# Patient Record
Sex: Female | Born: 1975 | Race: White | Hispanic: No | Marital: Married | Smoking: Never smoker
Health system: Southern US, Community
[De-identification: ages and names within clinical notes are randomized; demographics above are authoritative.]

## PROBLEM LIST (undated history)

## (undated) DIAGNOSIS — F419 Anxiety disorder, unspecified: Secondary | ICD-10-CM

## (undated) DIAGNOSIS — R7303 Prediabetes: Secondary | ICD-10-CM

## (undated) DIAGNOSIS — I10 Essential (primary) hypertension: Secondary | ICD-10-CM

## (undated) DIAGNOSIS — N201 Calculus of ureter: Secondary | ICD-10-CM

## (undated) DIAGNOSIS — E559 Vitamin D deficiency, unspecified: Secondary | ICD-10-CM

## (undated) DIAGNOSIS — E039 Hypothyroidism, unspecified: Secondary | ICD-10-CM

## (undated) DIAGNOSIS — I252 Old myocardial infarction: Secondary | ICD-10-CM

## (undated) DIAGNOSIS — N200 Calculus of kidney: Secondary | ICD-10-CM

## (undated) HISTORY — PX: OTHER SURGICAL HISTORY: SHX169

## (undated) HISTORY — DX: Hypothyroidism, unspecified: E03.9

## (undated) HISTORY — DX: Essential (primary) hypertension: I10

## (undated) HISTORY — DX: Calculus of kidney: N20.0

## (undated) HISTORY — DX: Old myocardial infarction: I25.2

## (undated) HISTORY — DX: Anxiety disorder, unspecified: F41.9

## (undated) HISTORY — DX: Vitamin D deficiency, unspecified: E55.9

---

## 2000-12-16 ENCOUNTER — Emergency Department (HOSPITAL_COMMUNITY): Admission: EM | Admit: 2000-12-16 | Discharge: 2000-12-17 | Payer: Self-pay | Admitting: Emergency Medicine

## 2000-12-17 ENCOUNTER — Encounter: Payer: Self-pay | Admitting: Emergency Medicine

## 2000-12-17 ENCOUNTER — Encounter: Payer: Self-pay | Admitting: Urology

## 2000-12-17 ENCOUNTER — Ambulatory Visit (HOSPITAL_COMMUNITY): Admission: AD | Admit: 2000-12-17 | Discharge: 2000-12-17 | Payer: Self-pay | Admitting: Urology

## 2000-12-17 ENCOUNTER — Encounter (INDEPENDENT_AMBULATORY_CARE_PROVIDER_SITE_OTHER): Payer: Self-pay | Admitting: Specialist

## 2002-10-18 ENCOUNTER — Other Ambulatory Visit: Admission: RE | Admit: 2002-10-18 | Discharge: 2002-10-18 | Payer: Self-pay | Admitting: Family Medicine

## 2003-10-31 ENCOUNTER — Other Ambulatory Visit: Admission: RE | Admit: 2003-10-31 | Discharge: 2003-10-31 | Payer: Self-pay | Admitting: Family Medicine

## 2005-01-05 ENCOUNTER — Other Ambulatory Visit: Admission: RE | Admit: 2005-01-05 | Discharge: 2005-01-05 | Payer: Self-pay | Admitting: Family Medicine

## 2005-03-13 ENCOUNTER — Other Ambulatory Visit: Admission: RE | Admit: 2005-03-13 | Discharge: 2005-03-13 | Payer: Self-pay | Admitting: Obstetrics and Gynecology

## 2005-08-12 ENCOUNTER — Ambulatory Visit: Payer: Self-pay | Admitting: Obstetrics and Gynecology

## 2005-08-19 ENCOUNTER — Ambulatory Visit: Payer: Self-pay | Admitting: Obstetrics and Gynecology

## 2005-08-19 ENCOUNTER — Inpatient Hospital Stay (HOSPITAL_COMMUNITY): Admission: AD | Admit: 2005-08-19 | Discharge: 2005-08-21 | Payer: Self-pay | Admitting: Obstetrics and Gynecology

## 2005-08-26 ENCOUNTER — Ambulatory Visit: Payer: Self-pay | Admitting: Obstetrics and Gynecology

## 2005-09-02 ENCOUNTER — Ambulatory Visit: Payer: Self-pay | Admitting: Gynecology

## 2005-09-08 ENCOUNTER — Ambulatory Visit: Payer: Self-pay | Admitting: Obstetrics and Gynecology

## 2005-09-15 ENCOUNTER — Ambulatory Visit: Payer: Self-pay | Admitting: Obstetrics and Gynecology

## 2005-09-17 ENCOUNTER — Encounter (INDEPENDENT_AMBULATORY_CARE_PROVIDER_SITE_OTHER): Payer: Self-pay | Admitting: *Deleted

## 2005-09-17 ENCOUNTER — Inpatient Hospital Stay (HOSPITAL_COMMUNITY): Admission: RE | Admit: 2005-09-17 | Discharge: 2005-09-20 | Payer: Self-pay | Admitting: Obstetrics and Gynecology

## 2005-09-23 ENCOUNTER — Inpatient Hospital Stay (HOSPITAL_COMMUNITY): Admission: AD | Admit: 2005-09-23 | Discharge: 2005-09-23 | Payer: Self-pay | Admitting: Obstetrics and Gynecology

## 2006-10-26 ENCOUNTER — Ambulatory Visit: Payer: Self-pay | Admitting: Family Medicine

## 2006-11-11 DIAGNOSIS — Z87442 Personal history of urinary calculi: Secondary | ICD-10-CM

## 2006-11-12 ENCOUNTER — Other Ambulatory Visit: Admission: RE | Admit: 2006-11-12 | Discharge: 2006-11-12 | Payer: Self-pay | Admitting: Family Medicine

## 2006-11-12 ENCOUNTER — Ambulatory Visit: Payer: Self-pay | Admitting: Family Medicine

## 2006-11-12 ENCOUNTER — Encounter (INDEPENDENT_AMBULATORY_CARE_PROVIDER_SITE_OTHER): Payer: Self-pay | Admitting: Family Medicine

## 2006-11-12 ENCOUNTER — Encounter: Payer: Self-pay | Admitting: Family Medicine

## 2006-11-24 ENCOUNTER — Ambulatory Visit: Payer: Self-pay | Admitting: Family Medicine

## 2006-11-25 LAB — CONVERTED CEMR LAB
Basophils Relative: 0.8 % (ref 0.0–1.0)
Eosinophils Relative: 1.8 % (ref 0.0–5.0)
HCT: 34.7 % — ABNORMAL LOW (ref 36.0–46.0)
Hemoglobin: 11.5 g/dL — ABNORMAL LOW (ref 12.0–15.0)
Lymphocytes Relative: 21.5 % (ref 12.0–46.0)
Monocytes Absolute: 0.5 10*3/uL (ref 0.2–0.7)
Neutro Abs: 5 10*3/uL (ref 1.4–7.7)
Platelets: 314 10*3/uL (ref 150–400)
TSH: 5.35 microintl units/mL (ref 0.35–5.50)
VLDL: 13 mg/dL (ref 0–40)
WBC: 7.2 10*3/uL (ref 4.5–10.5)

## 2006-11-29 ENCOUNTER — Telehealth (INDEPENDENT_AMBULATORY_CARE_PROVIDER_SITE_OTHER): Payer: Self-pay | Admitting: *Deleted

## 2006-12-27 ENCOUNTER — Encounter (INDEPENDENT_AMBULATORY_CARE_PROVIDER_SITE_OTHER): Payer: Self-pay | Admitting: Family Medicine

## 2007-06-03 ENCOUNTER — Ambulatory Visit: Payer: Self-pay | Admitting: Family Medicine

## 2007-06-03 DIAGNOSIS — S93609A Unspecified sprain of unspecified foot, initial encounter: Secondary | ICD-10-CM | POA: Insufficient documentation

## 2007-06-08 ENCOUNTER — Telehealth (INDEPENDENT_AMBULATORY_CARE_PROVIDER_SITE_OTHER): Payer: Self-pay | Admitting: Family Medicine

## 2007-06-10 ENCOUNTER — Ambulatory Visit: Payer: Self-pay | Admitting: Family Medicine

## 2007-06-21 ENCOUNTER — Telehealth (INDEPENDENT_AMBULATORY_CARE_PROVIDER_SITE_OTHER): Payer: Self-pay | Admitting: *Deleted

## 2008-08-09 ENCOUNTER — Encounter: Admission: RE | Admit: 2008-08-09 | Discharge: 2008-08-09 | Payer: Self-pay | Admitting: Obstetrics and Gynecology

## 2010-11-14 NOTE — Discharge Summary (Signed)
Elizabeth Gibson, Elizabeth Gibson                 ACCOUNT NO.:  [id number]   MEDICAL RECORD NO.:  [id number]          PATIENT TYPE:  INP   LOCATION:  9104                          FACILITY:  [facility]   PHYSICIAN:  Juluis Mire, M.D.   DATE OF BIRTH:  01-28-1976   DATE OF ADMISSION:  09/17/2005  DATE OF DISCHARGE:  09/20/2005                                 DISCHARGE SUMMARY   ADMITTING DIAGNOSES:  1.  Intrauterine pregnancy at term.  2.  Twin gestation with a breech/breech presentation.  3.  Mild pregnancy-induced hypertension.   DISCHARGE DIAGNOSES:  1.  Status post low transverse cesarean section.  2.  Viable female and female twin infants.   PROCEDURE:  Primary low transverse cesarean section.   REASON FOR ADMISSION:  Please see dictated H&P.   HOSPITAL COURSE:  The patient is 35 year old white married female who was  admitted to [facility] for a scheduled cesarean section.  The patient had known twin gestation with a breech/breech presentation.  The  patient had been followed closely in the office with weekly nonstress tests  which had been reactive.  With the last visit, the patient was noted to have  an elevation of blood pressure of 136/94.  The patient was also noted to  have 1+ proteinuria.  PIH labs had been drawn and were all normal with the  exception of one slightly elevated SGPT which was 41.  The patient was now  admitted for a scheduled cesarean section.  On admission, vital signs stable  with a blood pressure of 138/80.  The patient was taken to the operating  room where spinal anesthesia was administered without difficulty.  A low  transverse incision was made with the delivery of viable baby A female infant  weighing 7 pounds 1 ounce with Apgars of 7 at one minute, 7 at five minutes,  9 at 10 minutes.  Baby B female infant weighing 6 pounds 10 ounces, Apgars  of 8 at one minute and 9 at five minutes and 9 at ten minutes.  The patient  tolerated the procedure well  and was taken to the recovery room in stable  condition.   On postoperative day #1, the patient was without complaint.  Vital signs  were stable.  She was afebrile.  Abdomen was soft with good return of bowel  function.  Abdominal dressing was noted to be clean, dry and intact.  Laboratory findings revealed hemoglobin of 8.0.  SGOT and SGPT were within  normal limits.  Platelet count of 212,000.  The patient was ambulating well  and tolerating a regular diet without complaints of nausea and vomiting.   On postoperative day #2, the patient was without complaint.  Vital signs  remained stable.  She is afebrile.  Fundus firm and nontender.  Abdominal  dressing had been removed, revealing an incision that was clean, dry and  intact.   On postoperative day #3, the patient was without complaint.  Vital signs  were stable.  She was afebrile.  Fundus was firm and nontender.  The  incision was clean,  dry and intact.  Staples were removed, and the patient  was discharged home.   CONDITION ON DISCHARGE:  Stable.   DIET:  Regular as tolerated.   ACTIVITY:  No heavy lifting, no driving x2 weeks, no vaginal entry.   FOLLOW UP:  Patient is to follow up in the office in 1 week for an incision  check.  She is to call for temperature greater than 100 degrees, persistent  nausea or vomiting, heavy vaginal bleeding and/or redness or drainage from  the incisional site.   DISCHARGE MEDICATIONS:  1.  Tylox, #30, one p.o. every 4-6 hours p.r.n.  2.  Motrin 600 mg every 6 hours.  3.  Prenatal vitamins one p.o. daily.  4.  Colace one p.o. daily p.r.n.      Julio Sicks, N.P.      Juluis Mire, M.D.  Electronically Signed    CC/MEDQ  D:  10/09/2005  T:  10/09/2005  Job:  [id number]

## 2010-11-14 NOTE — Op Note (Signed)
[facility]  Patient:    Elizabeth Gibson, Elizabeth Gibson                         MRN: [id number] Proc. Date: 12/17/00 Adm. Date:  [id number] Attending:  Evlyn Clines                           Operative Report  PROCEDURE:  Cystoscopy, right ureteroscopic stone extraction, insertion of right double J stent.  PREOPERATIVE DIAGNOSIS:  Right proximal ureteral stone.  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  SURGEON:  Dr. Bjorn Pippin.  ANESTHESIA:  General.  SPECIMEN:  Stone.  DRAINS:  6 French 24 cm double J stent.  COMPLICATIONS:  None.  INDICATIONS FOR PROCEDURE:  [name] is a 36 year old white female who was sent from the emergency room with a right proximal ureteral stone and pain. After discussing the treatment options, she elected to proceed with ureteroscopy. She had some pyuria in the office and Levaquin was given at noon today.  FINDINGS AND PROCEDURE:  The patient was taken to the operating room where a general anesthetic was induced. She was placed in lithotomy position, her perineum and genitalia were prepped with Betadine solution and she was draped in the usual sterile fashion. Cystoscopy was performed using a 22 [place] scope and the 12 and 70 degree lenses. Examination revealed a normal urethra. The bladder wall was smooth and pale with a few small nodules consistent with follicular cystitis. The ureteral orifices were in the normal anatomic position. The right ureteral orifice was cannulated with a standard guidewire, this was passed through the kidney without difficulty. The cystoscope was removed and a 12/14 introducer sheath was then passed over the wire to the upper ureter. The dilator cord was removed and a 6 French flexible ureteroscope was then passed through the sheath to the proximal ureter. The scope was advanced into the kidney where fragments of the stone were identified. These  were engaged with a nitinol stone basket and removed in two passes. Reinspection of the kidney after removal of these stone fragments revealed no residual fragments. Inspection of the ureter revealed no residual fragments. At this point, the ureteroscope was removed, the guidewire was replaced, the sheath was removed, the cystoscope was inserted over the wire and a 6 French 24 cm double J stent with string was inserted without difficulty under fluoroscopic guidance. The wire was removed using good coil in the kidney, a good coil in the bladder. The bladder was drained, the cystoscope was removed. The stent string was tied and cut short. The patient was taken down from lithotomy position, her anesthetic was reversed and she was moved to the recovery room in stable condition. There were no complications. DD:  12/17/00 TD:  12/18/00 Job: [id number]

## 2010-11-14 NOTE — H&P (Signed)
Elizabeth Gibson, BAUMGARNER                 ACCOUNT NO.:  [id number]   MEDICAL RECORD NO.:  [id number]          PATIENT TYPE:  INP   LOCATION:                                FACILITY:  [facility]   PHYSICIAN:  Duke Salvia. Marcelle Overlie, M.D.DATE OF BIRTH:  1975-09-13   DATE OF ADMISSION:  09/17/2005  DATE OF DISCHARGE:                                HISTORY & PHYSICAL   Scheduled for primary cesarean section for twins on September 17, 2005.  Last  ultrasound showed presentation breech vertex.  She has had weekly twin  nonstress tests that have been reactive the last of which was September 15, 2005.  At that exam, BP 136/94, recheck left lateral 104/68 with 1+ protein,  minimal lower extremity edema, good fetal movement, no other complaints at  all.  PIH labs, dated September 15, 2005, were normal except for SGPT that was  41 (normal 0-40.)  This procedure including risk of bleeding, infection,  transfusion, adjacent organ injury, and her expected recovery time all  reviewed with her which she understands and accepts.   B positive.  Rubella titer is immune.  Her booking BP was up slightly at  138/80.  One-hour GTT was 129.   PAST MEDICAL HISTORY:   ALLERGIES:  None.   OPERATIONS:  Kidney stone surgically removed, 2003.   PHYSICAL EXAMINATION:  VITAL SIGNS:  Temp 98.2, blood pressure 104/68.  HEENT:  Unremarkable.  NECK:  Supple without masses.  LUNGS:  Clear.  CARDIOVASCULAR:  Regular rate and rhythm without murmurs, rubs, or gallops.  BREASTS:  Without masses.  ABDOMEN:  Fundal height was 45-cm.  Fetal heart rate A and B was in the  140s.  PELVIC:  Cervix was closed and long.  EXTREMITIES:  Revealed 1+ edema.  Reflexes 1 to 2+.  No clonus.   IMPRESSION:  1.  Breech vertex twin gestation near term.  2.  Mild pregnancy induced hypertension.   PLAN:  Primary cesarean section.  Procedure and risks were reviewed as  above.      Richard M. Marcelle Overlie, M.D.  Electronically Signed     RMH/MEDQ  D:   09/15/2005  T:  09/15/2005  Job:  [id number]

## 2010-11-14 NOTE — Op Note (Signed)
NAMELUCINDIA, Elizabeth Gibson                 ACCOUNT NO.:  [id number]   MEDICAL RECORD NO.:  [id number]          PATIENT TYPE:  INP   LOCATION:  WOC                           [facility]ILITY[facility]   PHYSICIAN:  Duke Salvia. Marcelle Overlie, M.D.DATE OF BIRTH:  1975/12/09   DATE OF PROCEDURE:  09/17/2005  DATE OF DISCHARGE:                                 OPERATIVE REPORT   PREOPERATIVE DIAGNOSIS:  Term twin gestation, breech-breech presentation.   POSTOPERATIVE DIAGNOSES:  Term twin gestation, breech-breech presentation.   PROCEDURE:  Primary low transverse cesarean section.   SURGEON:  Duke Salvia. Marcelle Overlie, M.D.   ASSESSMENT:  [name]. Henderson Cloud, M.D.   ANESTHESIA:  Spinal.   COMPLICATIONS:  None.   DRAINS:  Foley catheter.   ESTIMATED BLOOD LOSS:  900.   DESCRIPTION OF PROCEDURE:  The patient was taken to the operating room and  after an adequate level regional anesthesia was obtained with the patient in  the left tilt position, the abdomen prepped and draped in the usual manner  for sterile abdominal procedure. A Foley catheter positioned draining clear  urine, transverse Pfannenstiel incision was made 3 fingerbreadth's above the  symphysis, carried down to the fascia which was incised and extended  transversely. Rectus muscle divided in the midline, peritoneal entered  superiorly without incident and extended vertically. The bladder blade was  repositioned, the vesicouterine serosa was then incised and the bladder was  bluntly and sharply dissected off of the lower uterine segment and the  bladder blade repositioned. A transverse incision was made in the lower  segment, extended with blunt dissection transversely. Baby A was a boy in  the breech presentation delivered without difficulty, Apgar 7&7. The second  twin full breech extraction from a separate sac both with clear fluid, a  girl, Apgar were 8&9, both weights pending. The placenta was delivered  manually intact, sent to pathology. The uterus  was exteriorized, cavity  wiped cleaned with a laparotomy pack, closure obtained with a first layer of  #0 chromic in a locked fashion followed by an imbricating layer of #0  chromic. This was hemostatic, the tubes and ovaries were inspected and noted  to be normal. Prior to closure, sponge, needle and instrument counts were  reported as correct x2, peritoneum closed with a running 3-0 Vicryl suture.  The rectus muscle was reapproximated in the midline with 3-0 Vicryl  interrupted sutures, fascia closed from laterally to midline on either side  with a #0 PDS suture. The subcutaneous fat was hemostatic, clips and Steri-  Strips used on the skin. She  tolerated this well and went to the recovery room in good condition. She did  receive Ancef 1 gram IV after the cord was clamped along with Pitocin IV,  clear urine noted at the end of the case. Mother and baby is doing well at  that point.      Richard M. Marcelle Overlie, M.D.  Electronically Signed     RMH/MEDQ  D:  09/17/2005  T:  09/18/2005  Job:  [id number]

## 2011-09-19 LAB — HM PAP SMEAR: HM Pap smear: NORMAL

## 2012-08-26 ENCOUNTER — Encounter: Payer: Self-pay | Admitting: Family Medicine

## 2012-09-15 ENCOUNTER — Encounter: Payer: Self-pay | Admitting: Family Medicine

## 2012-10-13 ENCOUNTER — Ambulatory Visit: Payer: Self-pay | Admitting: Family Medicine

## 2012-11-16 ENCOUNTER — Ambulatory Visit: Payer: Self-pay | Admitting: Family Medicine

## 2012-12-08 ENCOUNTER — Encounter: Payer: Self-pay | Admitting: Family Medicine

## 2012-12-08 ENCOUNTER — Ambulatory Visit (INDEPENDENT_AMBULATORY_CARE_PROVIDER_SITE_OTHER): Payer: BC Managed Care – PPO | Admitting: Family Medicine

## 2012-12-08 VITALS — BP 130/80 | HR 67 | Temp 98.2°F | Ht 66.0 in | Wt 271.8 lb

## 2012-12-08 DIAGNOSIS — Z0000 Encounter for general adult medical examination without abnormal findings: Secondary | ICD-10-CM

## 2012-12-08 NOTE — Progress Notes (Signed)
  Subjective:    Patient ID: Elizabeth Gibson, female    DOB: [birth year], 37 y.o.   MRN: [id number]  HPI New to Establish.  Previous MD- Duanne Guess.  CPE- last pap 2013.  Recently lost 80 lbs and now has loose skin.  Feels like she has a boil in L thigh where skin is folding.   Review of Systems Patient reports no vision/ hearing changes, adenopathy,fever, weight change,  persistant/recurrent hoarseness , swallowing issues, chest pain, palpitations, edema, persistant/recurrent cough, hemoptysis, dyspnea (rest/exertional/paroxysmal nocturnal), gastrointestinal bleeding (melena, rectal bleeding), abdominal pain, significant heartburn, bowel changes, GU symptoms (dysuria, hematuria, incontinence), Gyn symptoms (abnormal  bleeding, pain),  syncope, focal weakness, memory loss, numbness & tingling, skin/hair/nail changes, abnormal bruising or bleeding, anxiety, or depression.     Objective:   Physical Exam  General Appearance:    Alert, cooperative, no distress, appears stated age  Head:    Normocephalic, without obvious abnormality, atraumatic  Eyes:    PERRL, conjunctiva/corneas clear, EOM's intact, fundi    benign, both eyes  Ears:    Normal TM's and external ear canals, both ears  Nose:   Nares normal, septum midline, mucosa normal, no drainage    or sinus tenderness  Throat:   Lips, mucosa, and tongue normal; teeth and gums normal  Neck:   Supple, symmetrical, trachea midline, no adenopathy;    Thyroid: no enlargement/tenderness/nodules  Back:     Symmetric, no curvature, ROM normal, no CVA tenderness  Lungs:     Clear to auscultation bilaterally, respirations unlabored  Chest Wall:    No tenderness or deformity   Heart:    Regular rate and rhythm, S1 and S2 normal, no murmur, rub   or gallop  Breast Exam:    No tenderness, masses, or nipple abnormality  Abdomen:     Soft, non-tender, bowel sounds active all four quadrants,    no masses, no organomegaly  Genitalia:    deferred  Rectal:     Extremities:   Extremities normal, atraumatic, no cyanosis or edema  Pulses:   2+ and symmetric all extremities  Skin:   Skin color, texture, turgor normal, no rashes or lesions  Lymph nodes:   Cervical, supraclavicular, and axillary nodes normal  Neurologic:   CNII-XII intact, normal strength, sensation and reflexes    throughout          Assessment & Plan:

## 2012-12-08 NOTE — Patient Instructions (Addendum)
We'll notify you of your lab results and make any changes if needed Keep up the good work on your healthy diet and regular exercise Call with any questions or concerns Think of [place] as your home base! Welcome!  We're glad to have you!!!

## 2012-12-09 LAB — HEPATIC FUNCTION PANEL
Albumin: 4 g/dL (ref 3.5–5.2)
Bilirubin, Direct: 0.1 mg/dL (ref 0.0–0.3)
Total Protein: 7.3 g/dL (ref 6.0–8.3)

## 2012-12-09 LAB — LIPID PANEL
Cholesterol: 137 mg/dL (ref 0–200)
Triglycerides: 97 mg/dL (ref 0.0–149.0)

## 2012-12-09 LAB — BASIC METABOLIC PANEL
CO2: 27 mEq/L (ref 19–32)
Calcium: 9 mg/dL (ref 8.4–10.5)
Creatinine, Ser: 0.7 mg/dL (ref 0.4–1.2)
Glucose, Bld: 79 mg/dL (ref 70–99)

## 2012-12-09 LAB — CBC WITH DIFFERENTIAL/PLATELET
Basophils Absolute: 0 10*3/uL (ref 0.0–0.1)
HCT: 37.8 % (ref 36.0–46.0)
Hemoglobin: 12.8 g/dL (ref 12.0–15.0)
Lymphs Abs: 1.9 10*3/uL (ref 0.7–4.0)
Monocytes Relative: 7.6 % (ref 3.0–12.0)
Neutro Abs: 5.2 10*3/uL (ref 1.4–7.7)
RDW: 12.5 % (ref 11.5–14.6)

## 2012-12-09 LAB — TSH: TSH: 2.36 u[IU]/mL (ref 0.35–5.50)

## 2012-12-09 NOTE — Assessment & Plan Note (Signed)
Pt's PE WNL.  UTD on GYN.  Check labs.  Anticipatory guidance provided.  

## 2012-12-13 ENCOUNTER — Encounter: Payer: Self-pay | Admitting: *Deleted

## 2012-12-14 LAB — VITAMIN D 1,25 DIHYDROXY: Vitamin D2 1, 25 (OH)2: <8 pg/mL

## 2012-12-22 ENCOUNTER — Encounter: Payer: Self-pay | Admitting: *Deleted

## 2013-09-08 ENCOUNTER — Ambulatory Visit: Payer: BC Managed Care – PPO | Admitting: Family Medicine

## 2013-09-18 ENCOUNTER — Ambulatory Visit (INDEPENDENT_AMBULATORY_CARE_PROVIDER_SITE_OTHER): Payer: BC Managed Care – PPO | Admitting: Family Medicine

## 2013-09-18 ENCOUNTER — Encounter: Payer: Self-pay | Admitting: Family Medicine

## 2013-09-18 VITALS — BP 130/80 | HR 66 | Temp 98.2°F | Resp 16 | Wt 275.1 lb

## 2013-09-18 DIAGNOSIS — T7840XA Allergy, unspecified, initial encounter: Secondary | ICD-10-CM

## 2013-09-18 MED ORDER — PREDNISONE 10 MG PO TABS: ORAL_TABLET | ORAL | Status: DC

## 2013-09-18 MED ORDER — METHYLPREDNISOLONE ACETATE 80 MG/ML IJ SUSP
80.0000 mg | Freq: Once | INTRAMUSCULAR | Status: AC
  Administered 2013-09-18: 80 mg via INTRAMUSCULAR

## 2013-09-18 MED ORDER — HYDROXYZINE HCL 50 MG PO TABS: 50.0000 mg | ORAL_TABLET | Freq: Three times a day (TID) | ORAL | Status: DC | PRN

## 2013-09-18 NOTE — Progress Notes (Signed)
   Subjective:    Patient ID: Elizabeth LeitzCasey Gibson, female    DOB: 07/16/1975, 38 y.o.   MRN: [id number]  HPI Allergies- pt reports sxs started a few years ago in the spring.  Hands, arms, neck, face, tops of feet will swell.  + itching.  No sxs in areas that are covered w/ clothing.  Taking Zyrtec w/o relief.  Using benadryl cream and coconut oil.  Took claritin and singulair previously w/o relief.  No similar sxs when she was in [place].   Review of Systems For ROS see HPI     Objective:   Physical Exam  Vitals reviewed. Constitutional: She appears well-developed and well-nourished. No distress.  Skin: Skin is warm and dry. There is erythema (diffusely on hands, arms, neck, face w/ swelling).          Assessment & Plan:

## 2013-09-18 NOTE — Assessment & Plan Note (Signed)
Pt appears to be having contact dermatitis to unknown allergic trigger- most likely pollen.  Start steroid taper, hydroxyzine, continue zyrtec.  Refer to allergist.  Pt expressed understanding and is in agreement w/ plan.

## 2013-09-18 NOTE — Patient Instructions (Signed)
We'll call you with your Allergy appt Start the Prednisone tomorrow Use the Hydroxyzine as needed for itching Call with any questions or concerns Hang in there!!

## 2013-09-18 NOTE — Progress Notes (Signed)
Pre visit review using our clinic review tool, if applicable. No additional management support is needed unless otherwise documented below in the visit note. 

## 2013-12-19 ENCOUNTER — Encounter: Payer: Self-pay | Admitting: Family Medicine

## 2013-12-19 ENCOUNTER — Telehealth: Payer: Self-pay

## 2013-12-19 ENCOUNTER — Ambulatory Visit (INDEPENDENT_AMBULATORY_CARE_PROVIDER_SITE_OTHER): Payer: BC Managed Care – PPO | Admitting: Family Medicine

## 2013-12-19 VITALS — BP 142/100 | HR 86 | Temp 98.6°F | Ht 66.0 in | Wt 296.0 lb

## 2013-12-19 DIAGNOSIS — L255 Unspecified contact dermatitis due to plants, except food: Secondary | ICD-10-CM

## 2013-12-19 MED ORDER — PREDNISONE 10 MG PO TABS: ORAL_TABLET | ORAL | Status: DC

## 2013-12-19 NOTE — Telephone Encounter (Addendum)
Pt called in tears stating that she feels miserable.  Recently she was gardening and got poison ivy all over her face, into her eyes, her mouth, on her feet, between her toes and over her chest and arms.  She went to urgent care and received a shot of prednisone.  She was also given a 6 day round of prednisone and was encouraged to take benadryl.  Her symptoms got better, but never completely went away.  She still has an itchy rash throughout her body.  Pt [facility] and was started on another round of prednisone.  Pt was not completely satisfied with the visit as the provider was not Dr. Beverely Lowabori and she felt the visit went too quick.  Therefore, she now wants to see Dr. Beverely Lowabori.  Pt stated that "I just itch all over and I'm miserable."  Pt's need for the prednisone was re-enforced.  Pt was also advised to use an oral antihistamine such benadryl and/or hydrocortisone cream topically for itching, to avoid the heat and hot showers as this may increase itching, to wear loose fitting clothes, and to avoid scratching.  Pt was also scheduled a follow up appointment, however, the appointment desk also shows that patient has physical appointment coming up on July 1st.  Pt felt that this appointment would suffice.  Pt was satisfied with the advice given and agreed to follow advice.  As advice was given, patient began to calm down.  She stated that she was just glad to have someone to talk to about what was going on.  Pt was encouraged to call us back if her symptoms worsen, new symptoms developed, or her current symptoms failed to improve. Pt stated understanding and agreed.

## 2013-12-19 NOTE — Patient Instructions (Signed)
       Poison Ivy Poison ivy is a inflammation of the skin (contact dermatitis) caused by touching the allergens on the leaves of the ivy plant following previous exposure to the plant. The rash usually appears 48 hours after exposure. The rash is usually bumps (papules) or blisters (vesicles) in a linear pattern. Depending on your own sensitivity, the rash may simply cause redness and itching, or it may also progress to blisters which may break open. These must be well cared for to prevent secondary bacterial (germ) infection, followed by scarring. Keep any open areas dry, clean, dressed, and covered with an antibacterial ointment if needed. The eyes may also get puffy. The puffiness is worst in the morning and gets better as the day progresses. This dermatitis usually heals without scarring, within 2 to 3 weeks without treatment. HOME CARE INSTRUCTIONS  Thoroughly wash with soap and water as soon as you have been exposed to poison ivy. You have about one half hour to remove the plant resin before it will cause the rash. This washing will destroy the oil or antigen on the skin that is causing, or will cause, the rash. Be sure to wash under your fingernails as any plant resin there will continue to spread the rash. Do not rub skin vigorously when washing affected area. Poison ivy cannot spread if no oil from the plant remains on your body. A rash that has progressed to weeping sores will not spread the rash unless you have not washed thoroughly. It is also important to wash any clothes you have been wearing as these may carry active allergens. The rash will return if you wear the unwashed clothing, even several days later. Avoidance of the plant in the future is the best measure. Poison ivy plant can be recognized by the number of leaves. Generally, poison ivy has three leaves with flowering branches on a single stem. Diphenhydramine may be purchased over the counter and used as needed for itching. Do not  drive with this medication if it makes you drowsy.Ask your caregiver about medication for children. SEEK MEDICAL CARE IF:  Open sores develop.  Redness spreads beyond area of rash.  You notice purulent (pus-like) discharge.  You have increased pain.  Other signs of infection develop (such as fever). Document Released: 06/12/2000 Document Revised: 09/07/2011 Document Reviewed: 05/01/2009 [employer] Patient Information 2015 [employer]. This information is not intended to replace advice given to you by your health care provider. Make sure you discuss any questions you have with your health care provider.  

## 2013-12-19 NOTE — Telephone Encounter (Signed)
Agree w/ advice given.  Thank you for the time you spent speaking to patient.

## 2013-12-19 NOTE — Progress Notes (Signed)
No chief complaint on file.   HPI:  Poison Ivy: - was gardening and got poison ivy all over on face, oral, feet and chest and arms - treated with shot of prednisone and then 6 day taper of prednisone and benadryl 2 weeks ago - got a lot better but never resolved still has itching and rash on arms and neck and face and trunk and leg - initially blisters, itchy rash all over -denies: fevers, malaise, SOB, wheezing, oral or eye lesions now  ROS: See pertinent positives and negatives per HPI.  Past Medical History  Diagnosis Date  . Hypertension   . Kidney stone     Past Surgical History  Procedure Laterality Date  . Cesarean section    . Removal of kidney stones      Family History  Problem Relation Age of Onset  . Cancer Maternal Aunt     lung  . Cancer Paternal Aunt     breast  . Cancer Paternal Grandmother     breast    History   Social History  . Marital Status: Married    Spouse Name: N/A    Number of Children: N/A  . Years of Education: N/A   Social History Main Topics  . Smoking status: Never Smoker   . Smokeless tobacco: Never Used  . Alcohol Use: No  . Drug Use: No  . Sexual Activity: None   Other Topics Concern  . None   Social History Narrative  . None    Current outpatient prescriptions:levonorgestrel (MIRENA) 20 MCG/24HR IUD, 1 each by Intrauterine route once., Disp: , Rfl: ;  predniSONE (DELTASONE) 10 MG tablet, 4 tablets (40 mg) daily for 3 days, then 3 tablets (30mg ) daily for 3 days, then 2 tablets (20 mg) daily for 3 days, then 1 tablet (10 mg) daily for 3 days.  12 + 9 + 6 + 3, Disp: 30 tablet, Rfl: 0  EXAM:  Filed Vitals:   12/19/13 1601  BP: 142/100  Pulse: 86  Temp: 98.6 F (37 C)    Body mass index is 47.8 kg/(m^2).  GENERAL: vitals reviewed and listed above, alert, oriented, appears well hydrated and in no acute distress  HEENT: atraumatic, conjunttiva clear, no obvious abnormalities on inspection of external nose and  ears  NECK: no obvious masses on inspection  LUNGS: clear to auscultation bilaterally, no wheezes, rales or rhonchi, good air movement  CV: HRRR, no peripheral edema  MS: moves all extremities without noticeable abnormality  SKIN: erythematous papuluar rash in patchy distribution on arms, feet, legs, trunk, neck  PSYCH: pleasant and cooperative, no obvious depression or anxiety  ASSESSMENT AND PLAN:  Discussed the following assessment and plan:  Toxicodendron dermatitis - Plan: predniSONE (DELTASONE) 10 MG tablet  -discussed likely rebound reaction form inadequate course of prednisone (discussed risks) -follow up with PCP in the next 2-4 weeks -Patient advised to return or notify a doctor immediately if symptoms worsen or persist or new concerns arise.  Patient Instructions  Poison [facility] Po[facility] ivy is a inflammation of the skin (contact dermatitis) caused by touching the allergens on the leaves of the ivy plant following previous exposure to the plant. The rash usually appears 48 hours after exposure. The rash is usually bumps (papules) or blisters (vesicles) in a linear pattern. Depending on your own sensitivity, the rash may simply cause redness and itching, or it may also progress to blisters which may break open. These must be well cared for to prevent  secondary bacterial (germ) infection, followed by scarring. Keep any open areas dry, clean, dressed, and covered with an antibacterial ointment if needed. The eyes may also get puffy. The puffiness is worst in the morning and gets better as the day progresses. This dermatitis usually heals without scarring, within 2 to 3 weeks without treatment. HOME CARE INSTRUCTIONS  Thoroughly wash with soap and water as soon as you have been exposed to poison ivy. You have about one half hour to remove the plant resin before it will cause the rash. This washing will destroy the oil or antigen on the skin that is causing, or will cause, the rash. Be  sure to wash under your fingernails as any plant resin there will continue to spread the rash. Do not rub skin vigorously when washing affected area. Poison ivy cannot spread if no oil from the plant remains on your body. A rash that has progressed to weeping sores will not spread the rash unless you have not washed thoroughly. It is also important to wash any clothes you have been wearing as these may carry active allergens. The rash will return if you wear the unwashed clothing, even several days later. Avoidance of the plant in the future is the best measure. Poison ivy plant can be recognized by the number of leaves. Generally, poison ivy has three leaves with flowering branches on a single stem. Diphenhydramine may be purchased over the counter and used as needed for itching. Do not drive with this medication if it makes you drowsy.Ask your caregiver about medication for children. SEEK MEDICAL CARE IF:  Open sores develop.  Redness spreads beyond area of rash.  You notice purulent (pus-like) discharge.  You have increased pain.  Other signs of infection develop (such as fever). Document Released: 06/12/2000 Document Revised: 09/07/2011 Document Reviewed: 05/01/2009 [facility] Patient Information 2015 [employer], [place]. This information is not intended to replace advice given to you by your health care Breahna Boylen. Make sure you discuss any questions you have with your health care Jonas Goh.      Kriste BasqueKIM, HANNAH R.

## 2013-12-19 NOTE — Progress Notes (Signed)
Pre visit review using our clinic review tool, if applicable. No additional management support is needed unless otherwise documented below in the visit note. 

## 2013-12-26 ENCOUNTER — Telehealth: Payer: Self-pay

## 2013-12-26 NOTE — Telephone Encounter (Signed)
Medication and allergies:  Reviewed and updated  90 day supply/mail order: n/a Local pharmacy:  [id number] - [place], [place] - [address]   Immunizations due:  See below   A/P: No changes to personal, family history or past surgical hx PAP- 09/19/11- normal Flu-did not receive Tdap- unsure of last vaccine   To Discuss with Provider: Rebound rash from poison ivy exposure.

## 2013-12-27 ENCOUNTER — Ambulatory Visit (INDEPENDENT_AMBULATORY_CARE_PROVIDER_SITE_OTHER): Payer: BC Managed Care – PPO | Admitting: Family Medicine

## 2013-12-27 ENCOUNTER — Encounter: Payer: Self-pay | Admitting: General Practice

## 2013-12-27 ENCOUNTER — Encounter: Payer: Self-pay | Admitting: Family Medicine

## 2013-12-27 VITALS — BP 132/78 | HR 76 | Temp 98.1°F | Resp 16 | Ht 66.5 in | Wt 297.5 lb

## 2013-12-27 DIAGNOSIS — Z0000 Encounter for general adult medical examination without abnormal findings: Secondary | ICD-10-CM

## 2013-12-27 DIAGNOSIS — Z23 Encounter for immunization: Secondary | ICD-10-CM

## 2013-12-27 LAB — BASIC METABOLIC PANEL
BUN: 15 mg/dL (ref 6–23)
CALCIUM: 9 mg/dL (ref 8.4–10.5)
CO2: 25 mEq/L (ref 19–32)
Chloride: 104 mEq/L (ref 96–112)
Creatinine, Ser: 0.6 mg/dL (ref 0.4–1.2)
GFR: 121.47 mL/min (ref >60.00)
GLUCOSE: 86 mg/dL (ref 70–99)
POTASSIUM: 3.8 meq/L (ref 3.5–5.1)
SODIUM: 138 meq/L (ref 135–145)

## 2013-12-27 LAB — LIPID PANEL
CHOL/HDL RATIO: 3
CHOLESTEROL: 217 mg/dL — AB (ref 0–200)
HDL: 62.5 mg/dL (ref >39.00)
LDL CALC: 141 mg/dL — AB (ref 0–99)
NONHDL: 154.5
Triglycerides: 68 mg/dL (ref 0.0–149.0)
VLDL: 13.6 mg/dL (ref 0.0–40.0)

## 2013-12-27 LAB — CBC WITH DIFFERENTIAL/PLATELET
BASOS PCT: 0.3 % (ref 0.0–3.0)
Basophils Absolute: 0 10*3/uL (ref 0.0–0.1)
EOS ABS: 0.1 10*3/uL (ref 0.0–0.7)
Eosinophils Relative: 0.5 % (ref 0.0–5.0)
HEMATOCRIT: 40.1 % (ref 36.0–46.0)
HEMOGLOBIN: 13.4 g/dL (ref 12.0–15.0)
LYMPHS ABS: 1.6 10*3/uL (ref 0.7–4.0)
LYMPHS PCT: 12.5 % (ref 12.0–46.0)
MCHC: 33.5 g/dL (ref 30.0–36.0)
MCV: 88.9 fl (ref 78.0–100.0)
MONOS PCT: 5.6 % (ref 3.0–12.0)
Monocytes Absolute: 0.7 10*3/uL (ref 0.1–1.0)
NEUTROS ABS: 10.3 10*3/uL — AB (ref 1.4–7.7)
Neutrophils Relative %: 81.1 % — ABNORMAL HIGH (ref 43.0–77.0)
Platelets: 289 10*3/uL (ref 150.0–400.0)
RBC: 4.52 Mil/uL (ref 3.87–5.11)
RDW: 13.1 % (ref 11.5–15.5)
WBC: 12.8 10*3/uL — ABNORMAL HIGH (ref 4.0–10.5)

## 2013-12-27 LAB — HEPATIC FUNCTION PANEL
ALT: 19 U/L (ref 0–35)
AST: 15 U/L (ref 0–37)
Albumin: 3.8 g/dL (ref 3.5–5.2)
Alkaline Phosphatase: 63 U/L (ref 39–117)
BILIRUBIN DIRECT: 0.1 mg/dL (ref 0.0–0.3)
TOTAL PROTEIN: 7.4 g/dL (ref 6.0–8.3)
Total Bilirubin: 0.5 mg/dL (ref 0.2–1.2)

## 2013-12-27 LAB — VITAMIN D 25 HYDROXY (VIT D DEFICIENCY, FRACTURES): VITD: 20.02 ng/mL

## 2013-12-27 LAB — HEMOGLOBIN A1C: Hgb A1c MFr Bld: 5.7 % (ref 4.6–6.5)

## 2013-12-27 LAB — TSH: TSH: 1.57 u[IU]/mL (ref 0.35–4.50)

## 2013-12-27 NOTE — Addendum Note (Signed)
Addended by: Jackson LatinoYLER, JESSICA L on: 12/27/2013 08:46 AM   Modules accepted: Orders

## 2013-12-27 NOTE — Progress Notes (Signed)
Pre visit review using our clinic review tool, if applicable. No additional management support is needed unless otherwise documented below in the visit note. 

## 2013-12-27 NOTE — Progress Notes (Signed)
   Subjective:    Patient ID: Elizabeth LeitzCasey Gibson, female    DOB: 11/15/1975, 38 y.o.   MRN: [id number]  HPI CPE- UTD on GYN.  No concerns today.   Review of Systems Patient reports no vision/ hearing changes, adenopathy,fever, weight change,  persistant/recurrent hoarseness , swallowing issues, chest pain, palpitations, edema, persistant/recurrent cough, hemoptysis, dyspnea (rest/exertional/paroxysmal nocturnal), gastrointestinal bleeding (melena, rectal bleeding), abdominal pain, significant heartburn, bowel changes, GU symptoms (dysuria, hematuria, incontinence), Gyn symptoms (abnormal  bleeding, pain),  syncope, focal weakness, memory loss, numbness & tingling, skin/hair/nail changes, abnormal bruising or bleeding, anxiety, or depression.     Objective:   Physical Exam  General Appearance:    Alert, cooperative, no distress, appears stated age, obese  Head:    Normocephalic, without obvious abnormality, atraumatic  Eyes:    R pupil is large and minimally reactive, L pupil WNL, conjunctiva/corneas clear, EOM's intact, fundi benign, both eyes  Ears:    Normal TM's and external ear canals, both ears  Nose:   Nares normal, septum midline, mucosa normal, no drainage    or sinus tenderness  Throat:   Lips, mucosa, and tongue normal; teeth and gums normal  Neck:   Supple, symmetrical, trachea midline, no adenopathy;    Thyroid: no enlargement/tenderness/nodules  Back:     Symmetric, no curvature, ROM normal, no CVA tenderness  Lungs:     Clear to auscultation bilaterally, respirations unlabored  Chest Wall:    No tenderness or deformity   Heart:    Regular rate and rhythm, S1 and S2 normal, no murmur, rub   or gallop  Breast Exam:    No tenderness, masses, or nipple abnormality  Abdomen:     Soft, non-tender, bowel sounds active all four quadrants,    no masses, no organomegaly  Genitalia:    Deferred until next year  Rectal:    Extremities:   Extremities normal, atraumatic, no cyanosis or  edema  Pulses:   2+ and symmetric all extremities  Skin:   Skin color, texture, turgor normal, no rashes or lesions  Lymph nodes:   Cervical, supraclavicular, and axillary nodes normal  Neurologic:   CNII-XII intact, normal strength, sensation and reflexes    throughout          Assessment & Plan:

## 2013-12-27 NOTE — Assessment & Plan Note (Signed)
Pt's PE WNL w/ exception of obesity.  UTD on pap.  Check labs.  Anticipatory guidance provided.  

## 2013-12-27 NOTE — Patient Instructions (Signed)
Follow up in 1 year or as needed Try and make healthy food choices and get regular exercise We'll notify you of your lab results and make any changes if needed Call with any questions or concerns Happy 4th of July!

## 2014-01-01 ENCOUNTER — Ambulatory Visit: Payer: BC Managed Care – PPO | Admitting: Family Medicine

## 2014-07-26 ENCOUNTER — Encounter: Payer: Self-pay | Admitting: Medical

## 2014-07-26 ENCOUNTER — Ambulatory Visit (INDEPENDENT_AMBULATORY_CARE_PROVIDER_SITE_OTHER): Payer: BLUE CROSS/BLUE SHIELD | Admitting: Medical

## 2014-07-26 VITALS — BP 127/80 | HR 73 | Temp 99.2°F | Wt 315.6 lb

## 2014-07-26 DIAGNOSIS — O900 Disruption of cesarean delivery wound: Secondary | ICD-10-CM

## 2014-07-26 DIAGNOSIS — S31109A Unspecified open wound of abdominal wall, unspecified quadrant without penetration into peritoneal cavity, initial encounter: Secondary | ICD-10-CM

## 2014-07-26 MED ORDER — CEPHALEXIN 500 MG PO CAPS: 500.0000 mg | ORAL_CAPSULE | Freq: Two times a day (BID) | ORAL | Status: DC

## 2014-07-26 NOTE — Progress Notes (Signed)
Pre visit review using our clinic review tool, if applicable. No additional management support is needed unless otherwise documented below in the visit note. 

## 2014-07-26 NOTE — Patient Instructions (Addendum)
For your wound, we will get wound culture and I will put you keflex pending the wound culture. I am going to go ahead and contact/refer you to your old gyn. Maybe other gyn.  Follow up in 7 days or as needed,  Discuss with pt amount of bleeding that would prompt recheck here or in ED. Also advised if feeling fatigue that should prompt visit as well. Currenlty only serosanguineous type drainage.

## 2014-07-26 NOTE — Assessment & Plan Note (Signed)
For your wound, we will get wound culture and I will put you keflex pending the wound culture. I am going to go ahead and contact/refer you to your old gyn. Maybe other gyn.  Discussed case with Dr. Beverely Lowabori.

## 2014-07-26 NOTE — Progress Notes (Signed)
   Subjective:    Patient ID: Elizabeth LeitzCasey Gibson, female    DOB: 08/03/1975, 39 y.o.   MRN: [id number]  HPI   Pt in states she had incision site that opened up. Her kids are 9. Pt states c section site appears to have opened up a little. Area is sore. This started today. No other procedures except for surgery when her kids were born. No fevers, no chills or sweats. No back pain.  Pt has mirena.  Pt saw blood this am getting out of the showers. Came from right side area old csection scar site.  Pt ob/gyn was Dr Marcelle OverlieHolland.   Review of Systems  Constitutional: Negative for fever, chills and fatigue.  Respiratory: Negative for cough, chest tightness, shortness of breath and wheezing.   Cardiovascular: Negative for chest pain and palpitations.  Gastrointestinal: Negative for abdominal pain.  Genitourinary: Negative for dysuria, urgency, frequency, hematuria, flank pain, difficulty urinating, genital sores and pelvic pain.  Musculoskeletal: Negative for back pain.  Skin:       Break in skin over old c-section.   Hematological: Negative for adenopathy. Does not bruise/bleed easily.   Past Medical History  Diagnosis Date  . Hypertension   . Kidney stone     History   Social History  . Marital Status: Married    Spouse Name: N/A    Number of Children: N/A  . Years of Education: N/A   Occupational History  . Not on file.   Social History Main Topics  . Smoking status: Never Smoker   . Smokeless tobacco: Never Used  . Alcohol Use: No  . Drug Use: No  . Sexual Activity: Not on file   Other Topics Concern  . Not on file   Social History Narrative    Past Surgical History  Procedure Laterality Date  . Cesarean section    . Removal of kidney stones      Family History  Problem Relation Age of Onset  . Cancer Maternal Aunt     lung  . Cancer Paternal Aunt     breast  . Cancer Paternal Grandmother     breast    No Known Allergies  Current Outpatient Prescriptions on  File Prior to Visit  Medication Sig Dispense Refill  . levonorgestrel (MIRENA) 20 MCG/24HR IUD 1 each by Intrauterine route once.     No current facility-administered medications on file prior to visit.    BP 127/80 mmHg  Pulse 73  Temp(Src) 99.2 F (37.3 C) (Oral)  Wt 315 lb 9.6 oz (143.155 kg)  SpO2 97%       Objective:   Physical Exam   General Appearance- Not in acute distress.  HEENT Eyes- Scleraeral/Conjuntiva-bilat- Not Yellow. Mouth & Throat- Normal.  Chest and Lung Exam Auscultation: Breath sounds:-Normal. Adventitious sounds:- No Adventitious sounds.  Cardiovascular Auscultation:Rythm - Regular. Heart Sounds -Normal heart sounds.  Abdomen Inspection:-Inspection Normal.  Palpation/Perucssion: Palpation and Percussion of the abdomen reveal- Non Tender, No Rebound tenderness, No rigidity(Guarding) and No Palpable abdominal masses.  Liver:-Normal.  Spleen:- Normal.   Area in question- show just midline on her c-section scar.Area circular 6 mm area that opened up. No ulcer present but tissue beneath. Mild serosanguinous drainage. Faint pink appearance around edges. No induration.      Assessment & Plan:

## 2014-07-29 LAB — WOUND CULTURE
GRAM STAIN: NONE SEEN
Gram Stain: NONE SEEN
Organism ID, Bacteria: NO GROWTH

## 2014-12-13 ENCOUNTER — Telehealth: Payer: Self-pay | Admitting: Family Medicine

## 2014-12-13 NOTE — Telephone Encounter (Signed)
Pre visit letter sent  °

## 2014-12-18 ENCOUNTER — Telehealth: Payer: Self-pay | Admitting: Family Medicine

## 2014-12-18 NOTE — Telephone Encounter (Signed)
pre visit letters mailed 12/14/14

## 2015-01-04 ENCOUNTER — Encounter: Payer: BC Managed Care – PPO | Admitting: Family Medicine

## 2015-09-05 ENCOUNTER — Other Ambulatory Visit: Payer: Self-pay | Admitting: Family Medicine

## 2015-09-05 DIAGNOSIS — N63 Unspecified lump in unspecified breast: Secondary | ICD-10-CM

## 2015-09-11 ENCOUNTER — Ambulatory Visit
Admission: RE | Admit: 2015-09-11 | Discharge: 2015-09-11 | Disposition: A | Discharge status: Home / Self Care | Payer: BC Managed Care – PPO | Source: Ambulatory Visit | Attending: Family Medicine | Admitting: Family Medicine

## 2015-09-11 DIAGNOSIS — N63 Unspecified lump in unspecified breast: Secondary | ICD-10-CM

## 2016-06-15 ENCOUNTER — Other Ambulatory Visit: Payer: Self-pay | Admitting: Orthopedic Surgery

## 2016-06-15 DIAGNOSIS — M25562 Pain in left knee: Secondary | ICD-10-CM

## 2016-06-30 ENCOUNTER — Ambulatory Visit
Admission: RE | Admit: 2016-06-30 | Discharge: 2016-06-30 | Disposition: A | Discharge status: Home / Self Care | Payer: BC Managed Care – PPO | Source: Ambulatory Visit | Attending: Orthopedic Surgery | Admitting: Orthopedic Surgery

## 2016-06-30 DIAGNOSIS — M25562 Pain in left knee: Secondary | ICD-10-CM

## 2016-09-02 ENCOUNTER — Other Ambulatory Visit: Payer: Self-pay | Admitting: Family Medicine

## 2016-09-02 DIAGNOSIS — Z1231 Encounter for screening mammogram for malignant neoplasm of breast: Secondary | ICD-10-CM

## 2016-09-16 ENCOUNTER — Ambulatory Visit: Payer: BC Managed Care – PPO

## 2016-09-30 ENCOUNTER — Ambulatory Visit
Admission: RE | Admit: 2016-09-30 | Discharge: 2016-09-30 | Disposition: A | Discharge status: Home / Self Care | Payer: BC Managed Care – PPO | Source: Ambulatory Visit | Attending: Family Medicine | Admitting: Family Medicine

## 2016-09-30 DIAGNOSIS — Z1231 Encounter for screening mammogram for malignant neoplasm of breast: Secondary | ICD-10-CM

## 2018-03-28 ENCOUNTER — Other Ambulatory Visit: Payer: Self-pay | Admitting: Family Medicine

## 2018-03-28 DIAGNOSIS — Z1231 Encounter for screening mammogram for malignant neoplasm of breast: Secondary | ICD-10-CM

## 2018-04-28 ENCOUNTER — Ambulatory Visit
Admission: RE | Admit: 2018-04-28 | Discharge: 2018-04-28 | Disposition: A | Discharge status: Home / Self Care | Payer: BC Managed Care – PPO | Source: Ambulatory Visit | Attending: Family Medicine | Admitting: Family Medicine

## 2018-04-28 DIAGNOSIS — Z1231 Encounter for screening mammogram for malignant neoplasm of breast: Secondary | ICD-10-CM

## 2018-07-07 ENCOUNTER — Encounter (INDEPENDENT_AMBULATORY_CARE_PROVIDER_SITE_OTHER): Payer: Self-pay

## 2018-07-14 ENCOUNTER — Encounter (INDEPENDENT_AMBULATORY_CARE_PROVIDER_SITE_OTHER): Payer: Self-pay | Admitting: Family Medicine

## 2018-07-14 ENCOUNTER — Ambulatory Visit (INDEPENDENT_AMBULATORY_CARE_PROVIDER_SITE_OTHER): Payer: BC Managed Care – PPO | Admitting: Family Medicine

## 2018-07-14 VITALS — BP 156/88 | HR 84 | Temp 97.6°F | Ht 66.0 in | Wt 327.0 lb

## 2018-07-14 DIAGNOSIS — R5383 Other fatigue: Secondary | ICD-10-CM | POA: Diagnosis not present

## 2018-07-14 DIAGNOSIS — R0602 Shortness of breath: Secondary | ICD-10-CM

## 2018-07-14 DIAGNOSIS — F3289 Other specified depressive episodes: Secondary | ICD-10-CM

## 2018-07-14 DIAGNOSIS — E038 Other specified hypothyroidism: Secondary | ICD-10-CM | POA: Diagnosis not present

## 2018-07-14 DIAGNOSIS — Z9189 Other specified personal risk factors, not elsewhere classified: Secondary | ICD-10-CM | POA: Diagnosis not present

## 2018-07-14 DIAGNOSIS — Z0289 Encounter for other administrative examinations: Secondary | ICD-10-CM

## 2018-07-14 DIAGNOSIS — I10 Essential (primary) hypertension: Secondary | ICD-10-CM | POA: Diagnosis not present

## 2018-07-14 DIAGNOSIS — E559 Vitamin D deficiency, unspecified: Secondary | ICD-10-CM

## 2018-07-14 DIAGNOSIS — Z6841 Body Mass Index (BMI) 40.0 and over, adult: Secondary | ICD-10-CM

## 2018-07-15 LAB — COMPREHENSIVE METABOLIC PANEL
ALT: 13 IU/L (ref 0–32)
AST: 12 IU/L (ref 0–40)
Albumin/Globulin Ratio: 1.6 (ref 1.2–2.2)
Albumin: 4.4 g/dL (ref 3.5–5.5)
Alkaline Phosphatase: 107 IU/L (ref 39–117)
BUN/Creatinine Ratio: 18 (ref 9–23)
BUN: 11 mg/dL (ref 6–24)
Bilirubin Total: 0.3 mg/dL (ref 0.0–1.2)
CO2: 23 mmol/L (ref 20–29)
CREATININE: 0.62 mg/dL (ref 0.57–1.00)
Calcium: 9.3 mg/dL (ref 8.7–10.2)
Chloride: 103 mmol/L (ref 96–106)
GFR calc Af Amer: 129 mL/min/{1.73_m2} (ref >59)
GFR calc non Af Amer: 112 mL/min/{1.73_m2} (ref >59)
Globulin, Total: 2.8 g/dL (ref 1.5–4.5)
Glucose: 94 mg/dL (ref 65–99)
Potassium: 4.4 mmol/L (ref 3.5–5.2)
Sodium: 140 mmol/L (ref 134–144)
Total Protein: 7.2 g/dL (ref 6.0–8.5)

## 2018-07-15 LAB — HEMOGLOBIN A1C
Est. average glucose Bld gHb Est-mCnc: 117 mg/dL
Hgb A1c MFr Bld: 5.7 % — ABNORMAL HIGH (ref 4.8–5.6)

## 2018-07-15 LAB — CBC WITH DIFFERENTIAL
Basophils Absolute: 0.1 10*3/uL (ref 0.0–0.2)
Basos: 1 %
EOS (ABSOLUTE): 0.1 10*3/uL (ref 0.0–0.4)
Eos: 2 %
HEMOGLOBIN: 11 g/dL — AB (ref 11.1–15.9)
Hematocrit: 34.5 % (ref 34.0–46.6)
Immature Grans (Abs): 0 10*3/uL (ref 0.0–0.1)
Immature Granulocytes: 0 %
Lymphocytes Absolute: 2 10*3/uL (ref 0.7–3.1)
Lymphs: 22 %
MCH: 24.4 pg — ABNORMAL LOW (ref 26.6–33.0)
MCHC: 31.9 g/dL (ref 31.5–35.7)
MCV: 77 fL — ABNORMAL LOW (ref 79–97)
Monocytes Absolute: 0.6 10*3/uL (ref 0.1–0.9)
Monocytes: 7 %
Neutrophils Absolute: 6.5 10*3/uL (ref 1.4–7.0)
Neutrophils: 68 %
RBC: 4.51 x10E6/uL (ref 3.77–5.28)
RDW: 14.5 % (ref 11.7–15.4)
WBC: 9.3 10*3/uL (ref 3.4–10.8)

## 2018-07-15 LAB — LIPID PANEL WITH LDL/HDL RATIO
CHOLESTEROL TOTAL: 191 mg/dL (ref 100–199)
HDL: 51 mg/dL (ref >39)
LDL Calculated: 121 mg/dL — ABNORMAL HIGH (ref 0–99)
LDl/HDL Ratio: 2.4 ratio (ref 0.0–3.2)
Triglycerides: 96 mg/dL (ref 0–149)
VLDL Cholesterol Cal: 19 mg/dL (ref 5–40)

## 2018-07-15 LAB — FOLATE: Folate: 4.9 ng/mL (ref >3.0)

## 2018-07-15 LAB — T3: T3, Total: 138 ng/dL (ref 71–180)

## 2018-07-15 LAB — VITAMIN D 25 HYDROXY (VIT D DEFICIENCY, FRACTURES): Vit D, 25-Hydroxy: 7.6 ng/mL — ABNORMAL LOW (ref 30.0–100.0)

## 2018-07-15 LAB — INSULIN, RANDOM: INSULIN: 27.3 u[IU]/mL — ABNORMAL HIGH (ref 2.6–24.9)

## 2018-07-15 LAB — TSH: TSH: 2.31 u[IU]/mL (ref 0.450–4.500)

## 2018-07-15 LAB — T4, FREE: FREE T4: 1.06 ng/dL (ref 0.82–1.77)

## 2018-07-15 LAB — VITAMIN B12: Vitamin B-12: 652 pg/mL (ref 232–1245)

## 2018-07-18 NOTE — Progress Notes (Signed)
Office: [phone]  /  Fax: [phone]   Dear Dr. Lewis MoccasinElizabeth R. Gibson,   Thank you for referring Elizabeth Gibson to our clinic. The following note includes my evaluation and treatment recommendations.  HPI:   Chief Complaint: OBESITY    Elizabeth Gibson has been referred by Dr. Lewis MoccasinElizabeth R. Gibson for consultation regarding her obesity and obesity related comorbidities.    Elizabeth Gibson (MR# [id number]) is a 43 y.o. female who presents on 07/14/2018 for obesity evaluation and treatment. Current BMI is Body mass index is 52.78 kg/m. Elizabeth Gibson has been struggling with her weight for many years and has been unsuccessful in either losing weight, maintaining weight loss, or reaching her healthy weight goal. Elizabeth Gibson's husband does the cooking and shopping and is willing to support her.     Elizabeth Gibson attended our information session and states she is currently in the action stage of change and ready to dedicate time achieving and maintaining a healthier weight. Elizabeth Gibson is interested in becoming our patient and working on intensive lifestyle modifications including (but not limited to) diet, exercise and weight loss.    Elizabeth Gibson states her family eats meals together she thinks her family will eat healthier with her her desired weight loss is 147 lbs she has been heavy most of her life she started gaining weight in college her heaviest weight ever was 340 lbs. she is a little bit of a picky eater and doesn't like to eat healthier foods  she has significant food cravings issues  she skips breakfast sometimes she is frequently drinking liquids with calories she frequently makes poor food choices she has problems with excessive hunger  she frequently eats larger portions than normal  she has binge eating behaviors she struggles with emotional eating    Fatigue Elizabeth Gibson feels her energy is lower than it should be. This has worsened with weight gain and has not worsened recently. Elizabeth Gibson admits to some daytime somnolence  and admits to sometimes waking up still tired. Patient is at risk for obstructive sleep apnea. Patent has a history of symptoms of hypertension. Patient generally gets 7 hours of sleep per night, and states they generally have generally restful sleep. Snoring is present. Apneic episodes are not present. Epworth Sleepiness Score is 6.  Dyspnea on exertion Elizabeth Gibson notes increasing shortness of breath with exercising and seems to be worsening over time with weight gain. She notes getting out of breath sooner with activity than she used to. This has not gotten worse recently.Elizabeth Gibson denies orthopnea.  Hypertension Elizabeth Gibson is a 43 y.o. female with hypertension. Elizabeth Gibson's blood pressure is elevated today at 156/88, as she has been out of blood pressure medications for over 10 days. She is getting her refill tomorrow from her PCP. She is working on weight loss to help control her blood pressure with the goal of decreasing her risk of heart attack and stroke. Elizabeth Gibson denies chest pain or headache.  At risk for cardiovascular disease Elizabeth Gibson is at a higher than average risk for cardiovascular disease due to hypertension and obesity. She currently denies any chest pain.  Hypothyroid Elizabeth Gibson has a diagnosis of hypothyroidism. She is on Synthroid 50 mcg. She denies hot or cold intolerance or palpitations, but does admit to ongoing fatigue.  Vitamin D deficiency Elizabeth Gibson has a diagnosis of vitamin D deficiency. She is currently taking OTC vit D. She admits fatigue and denies nausea, vomiting, or muscle weakness.  Depression with emotional eating behaviors Elizabeth Gibson is on Wellbutrin XL 150mg  daily. She notes  significant emotional eating and grew up feeling judged by her mother who she describes as "weight obsessed". She tends to have an all or nothing attitude. Elizabeth Gibson is tearful in the office and discussing this. She is struggling with emotional eating and using food for comfort to the extent that it is negatively impacting her  health. She often snacks when she is not hungry. Elizabeth Gibson sometimes feels she is out of control and then feels guilty that she made poor food choices. She has been working on behavior modification techniques to help reduce her emotional eating and has been somewhat successful.   Depression Screen Elizabeth Gibson's Food and Mood (modified PHQ-9) score was 10. Depression screen Elizabeth Gibson 2/9 07/14/2018 12/27/2013  Decreased Interest 1 0  Down, Depressed, Hopeless 1 0  PHQ - 2 Score 2 0  Altered sleeping 1 -  Tired, decreased energy 2 -  Change in appetite 2 -  Feeling bad or failure about yourself  3 -  Trouble concentrating 0 -  Moving slowly or fidgety/restless 0 -  Suicidal thoughts 0 -  PHQ-9 Score 10 -  Difficult doing work/chores Somewhat difficult -   ASSESSMENT AND PLAN:  Other fatigue - Plan: EKG 12-Lead, Vitamin B12, CBC With Differential, Comprehensive metabolic panel, Folate, Hemoglobin A1c, Insulin, random  Shortness of breath on exertion - Plan: Lipid Panel With LDL/HDL Ratio  Essential hypertension  Other specified hypothyroidism - Plan: T3, T4, free, TSH  Vitamin D deficiency - Plan: VITAMIN D 25 Hydroxy (Vit-D Deficiency, Fractures)  Other depression - with emotional eating  At risk for heart disease  Class 3 severe obesity with serious comorbidity and body mass index (BMI) of 50.0 to 59.9 in adult, unspecified obesity type (HCC)  PLAN:  Fatigue Elizabeth Gibson was informed that her fatigue may be related to obesity, depression or many other causes. Labs will be ordered, and in the meanwhile Elizabeth Gibson has agreed to work on diet, exercise and weight loss to help with fatigue. Proper sleep hygiene was discussed including the need for 7-8 hours of quality sleep each night. A sleep study was not ordered based on symptoms and Epworth score. An EkG and an indirect calorimetry was ordered today and Kapree will follow up as directed in 2 weeks.  Dyspnea on exertion Haneefah's shortness of breath appears to  be obesity related and exercise induced. She has agreed to work on weight loss and gradually increase exercise to treat her exercise induced shortness of breath. If Shavonne follows our instructions and loses weight without improvement of her shortness of breath, we will plan to refer to pulmonology. We will monitor this condition regularly. We will order labs, an indirect calorimetry, and EKG today. Elizabeth Gibson agrees to this plan.  Hypertension We discussed sodium restriction, working on healthy weight loss, and a regular exercise program as the means to achieve improved blood pressure control. We will continue to monitor her blood pressure as well as her progress with the above lifestyle modifications. She will continue her medications as prescribed and will watch for signs of hypotension as she continues her lifestyle modifications. We will order labs today. She agrees to restart her medicines and we will recheck her blood pressure in 2 weeks. Elizabeth Gibson agreed with this plan and agreed to follow up as directed.  Cardiovascular risk counseling Elizabeth Gibson was given extended (30 minutes) coronary artery disease prevention counseling today. She is 43 y.o. female and has risk factors for heart disease including hypertension and obesity. We discussed intensive lifestyle modifications today with  an emphasis on specific weight loss instructions and strategies. Pt was also informed of the importance of increasing exercise and decreasing saturated fats to help prevent heart disease.  Hypothyroid Elizabeth Gibson was informed of the importance of good thyroid control to help with weight loss efforts. She was also informed that supertherapeutic thyroid levels are dangerous and will not improve weight loss results. We will order labs today. Alsha agrees to continue Synthroid and will follow up in 2 weeks.  Vitamin D Deficiency Elizabeth Gibson was informed that low vitamin D levels contributes to fatigue and are associated with obesity, breast, and  colon cancer. Labs were ordered today and Elizabeth Gibson agrees to follow up at the agreed upon time.  Depression with Emotional Eating Behaviors We discussed behavior modification techniques today to help Elizabeth Gibson deal with her emotional eating and depression. She has agreed to continue to take her medications and she will be referred to Dr. Dewaine Conger, our bariatric psychologist for evaluation due to elevated PHQ-9 score and significant struggles with emotional eating. Comfort and support was offered to her and Elizabeth Gibson agreed to see Dr. Dewaine Conger in 2 weeks and to follow up as directed.  Depression Screen Dariane had a moderately positive depression screening. Depression is commonly associated with obesity and often results in emotional eating behaviors. We will monitor this closely and work on CBT to help improve the non-hunger eating patterns. Referral to Psychology may be required if no improvement is seen as she continues in our clinic.  Obesity Elizabeth Gibson is currently in the action stage of change and her goal is to continue with weight loss efforts. I recommend Elizabeth Gibson begin the structured treatment plan as follows:  She has agreed to follow the Category 3 plan + 100 calorie condiments. Elizabeth Gibson has been instructed to eventually work up to a goal of 150 minutes of combined cardio and strengthening exercise per week for weight loss and overall health benefits. We discussed the following Behavioral Modification Strategies today: increasing lean protein intake, decreasing simple carbohydrates, and work on meal planning and easy cooking plans.   She was informed of the importance of frequent follow up visits to maximize her success with intensive lifestyle modifications for her multiple health conditions. She was informed we would discuss her lab results at her next visit unless there is a critical issue that needs to be addressed sooner. Farhiya agreed to keep her next visit at the agreed upon time to discuss these  results.  ALLERGIES: No Known Allergies  MEDICATIONS: Current Outpatient Medications on File Prior to Visit  Medication Sig Dispense Refill  . buPROPion (WELLBUTRIN XL) 150 MG 24 hr tablet Take 150 mg by mouth daily.    Marland Kitchen levothyroxine (SYNTHROID, LEVOTHROID) 50 MCG tablet Take 50 mcg by mouth daily before breakfast.    . losartan-hydrochlorothiazide (HYZAAR) 50-12.5 MG tablet Take 1 tablet by mouth daily.     No current facility-administered medications on file prior to visit.     PAST MEDICAL HISTORY: Past Medical History:  Diagnosis Date  . Anxiety   . History of heart attack   . Hypertension   . Hypothyroidism   . Kidney stone   . Vitamin D deficiency     PAST SURGICAL HISTORY: Past Surgical History:  Procedure Laterality Date  . CESAREAN SECTION    . removal of kidney stones      SOCIAL HISTORY: Social History   Tobacco Use  . Smoking status: Never Smoker  . Smokeless tobacco: Never Used  Substance Use Topics  .  Alcohol use: No  . Drug use: No    FAMILY HISTORY: Family History  Problem Relation Age of Onset  . Cancer Maternal Aunt        lung  . Breast cancer Maternal Aunt   . Cancer Paternal Aunt        breast  . Cancer Paternal Grandmother        breast  . Eating disorder Mother   . Hypertension Father   . Stroke Father   . Heart disease Father    ROS: Review of Systems  Constitutional: Positive for malaise/fatigue. Negative for weight loss.  HENT: Positive for sinus pain.   Eyes: Positive for blurred vision.       Positive for vision changes.  Respiratory: Positive for shortness of breath.   Cardiovascular: Negative for chest pain and palpitations.  Gastrointestinal: Positive for heartburn. Negative for nausea and vomiting.  Musculoskeletal:       Positive for neck stiffness. Negative for muscle weakness.  Neurological: Positive for headaches.  Endo/Heme/Allergies:       Negative for heat intolerance. Negative for cold intolerance.   Psychiatric/Behavioral: Positive for depression.   PHYSICAL EXAM: Blood pressure (!) 156/88, pulse 84, temperature 97.6 F (36.4 C), temperature source Oral, height 5\' 6"  (1.676 m), weight (!) 327 lb (148.3 kg), last menstrual period 06/27/2018, SpO2 95 %. Body mass index is 52.78 kg/m. Physical Exam Vitals signs reviewed.  Constitutional:      Appearance: Normal appearance. She is obese.  HENT:     Head: Normocephalic and atraumatic.     Nose: Nose normal.  Eyes:     General: No scleral icterus.    Extraocular Movements: Extraocular movements intact.     Comments: Positive for right pupil large and fixed. Tonic pupil per pt.  Neck:     Musculoskeletal: Normal range of motion and neck supple.     Comments: Negative for thyromegaly. Cardiovascular:     Rate and Rhythm: Normal rate and regular rhythm.     Heart sounds: Murmur ( early) present. Systolic murmur present with a grade of 1/6.  Pulmonary:     Effort: Pulmonary effort is normal. No respiratory distress.  Abdominal:     Palpations: Abdomen is soft.     Tenderness: There is no abdominal tenderness.     Comments: Positive for obesity.  Musculoskeletal:     Comments: ROM normal in all extremities.  Skin:    General: Skin is warm and dry.  Neurological:     Mental Status: She is alert and oriented to person, place, and time.     Coordination: Coordination normal.  Psychiatric:        Mood and Affect: Mood normal.        Behavior: Behavior normal.    RECENT LABS AND TESTS: BMET    Component Value Date/Time   NA 140 07/14/2018 1145   K 4.4 07/14/2018 1145   CL 103 07/14/2018 1145   CO2 23 07/14/2018 1145   GLUCOSE 94 07/14/2018 1145   GLUCOSE 86 12/27/2013 0845   BUN 11 07/14/2018 1145   CREATININE 0.62 07/14/2018 1145   CALCIUM 9.3 07/14/2018 1145   GFRNONAA 112 07/14/2018 1145   GFRAA 129 07/14/2018 1145   Lab Results  Component Value Date   HGBA1C 5.7 (H) 07/14/2018   Lab Results  Component Value  Date   INSULIN 27.3 (H) 07/14/2018   CBC    Component Value Date/Time   WBC 9.3 07/14/2018 1145  WBC 12.8 (H) 12/27/2013 0845   RBC 4.51 07/14/2018 1145   RBC 4.52 12/27/2013 0845   HGB 11.0 (L) 07/14/2018 1145   HCT 34.5 07/14/2018 1145   PLT 289.0 12/27/2013 0845   MCV 77 (L) 07/14/2018 1145   MCH 24.4 (L) 07/14/2018 1145   MCHC 31.9 07/14/2018 1145   MCHC 33.5 12/27/2013 0845   RDW 14.5 07/14/2018 1145   LYMPHSABS 2.0 07/14/2018 1145   MONOABS 0.7 12/27/2013 0845   EOSABS 0.1 07/14/2018 1145   BASOSABS 0.1 07/14/2018 1145   Iron/TIBC/Ferritin/ %Sat No results found for: IRON, TIBC, FERRITIN, IRONPCTSAT Lipid Panel     Component Value Date/Time   CHOL 191 07/14/2018 1145   TRIG 96 07/14/2018 1145   HDL 51 07/14/2018 1145   CHOLHDL 3 12/27/2013 0845   VLDL 13.6 12/27/2013 0845   LDLCALC 121 (H) 07/14/2018 1145   Hepatic Function Panel     Component Value Date/Time   PROT 7.2 07/14/2018 1145   ALBUMIN 4.4 07/14/2018 1145   AST 12 07/14/2018 1145   ALT 13 07/14/2018 1145   ALKPHOS 107 07/14/2018 1145   BILITOT 0.3 07/14/2018 1145   BILIDIR 0.1 12/27/2013 0845      Component Value Date/Time   TSH 2.310 07/14/2018 1145   TSH 1.57 12/27/2013 0845   TSH 2.36 12/08/2012 1542   ECG  shows NSR with a rate of 78 BPM. INDIRECT CALORIMETER done today shows a VO2 of 391 and a REE of 2722.  Her calculated basal metabolic rate is 6378 thus her basal metabolic rate is better than expected.  OBESITY BEHAVIORAL INTERVENTION VISIT  Today's visit was # 1   Starting weight: 327 lbs Starting date: 07/14/2018 Today's weight : Weight: (!) 327 lb (148.3 kg)  Today's date: 07/14/2018 Total lbs lost to date: 0  ASK: We discussed the diagnosis of obesity with Elizabeth Gibson today and Carolena agreed to give [place] permission to discuss obesity behavioral modification therapy today.  ASSESS: Sofhia has the diagnosis of obesity and her BMI today is 52.8. Karolina is in the action stage of  change.   ADVISE: Earnestine was educated on the multiple health risks of obesity as well as the benefit of weight loss to improve her health. She was advised of the need for long term treatment and the importance of lifestyle modifications to improve her current health and to decrease her risk of future health problems.  AGREE: Multiple dietary modification options and treatment options were discussed and Lavee agreed to follow the recommendations documented in the above note.  ARRANGE: Phoenicia was educated on the importance of frequent visits to treat obesity as outlined per CMS and USPSTF guidelines and agreed to schedule her next follow up appointment today.  I, Kirke Corin, am acting as transcriptionist for Wilder Glade, MD  I have reviewed the above documentation for accuracy and completeness, and I agree with the above. -Quillian Quince, MD
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
## 2018-07-28 ENCOUNTER — Ambulatory Visit (INDEPENDENT_AMBULATORY_CARE_PROVIDER_SITE_OTHER): Payer: BC Managed Care – PPO | Admitting: Family Medicine

## 2018-07-28 ENCOUNTER — Ambulatory Visit (INDEPENDENT_AMBULATORY_CARE_PROVIDER_SITE_OTHER): Payer: BC Managed Care – PPO | Admitting: Psychology

## 2018-07-28 ENCOUNTER — Encounter (INDEPENDENT_AMBULATORY_CARE_PROVIDER_SITE_OTHER): Payer: Self-pay | Admitting: Family Medicine

## 2018-07-28 VITALS — BP 124/84 | HR 92 | Temp 97.9°F | Ht 66.0 in | Wt 318.0 lb

## 2018-07-28 DIAGNOSIS — R7303 Prediabetes: Secondary | ICD-10-CM

## 2018-07-28 DIAGNOSIS — F3289 Other specified depressive episodes: Secondary | ICD-10-CM

## 2018-07-28 DIAGNOSIS — D508 Other iron deficiency anemias: Secondary | ICD-10-CM | POA: Diagnosis not present

## 2018-07-28 DIAGNOSIS — Z6841 Body Mass Index (BMI) 40.0 and over, adult: Secondary | ICD-10-CM

## 2018-07-28 DIAGNOSIS — E559 Vitamin D deficiency, unspecified: Secondary | ICD-10-CM | POA: Diagnosis not present

## 2018-07-28 DIAGNOSIS — Z9189 Other specified personal risk factors, not elsewhere classified: Secondary | ICD-10-CM

## 2018-07-28 MED ORDER — METFORMIN HCL 500 MG PO TABS: 500.0000 mg | ORAL_TABLET | Freq: Every day | ORAL | 0 refills | Status: DC

## 2018-07-28 MED ORDER — VITAMIN D (ERGOCALCIFEROL) 1.25 MG (50000 UNIT) PO CAPS: 50000.0000 [IU] | ORAL_CAPSULE | ORAL | 0 refills | Status: DC

## 2018-08-01 NOTE — Progress Notes (Signed)
Office: [phone]  /  Fax: [phone]   HPI:   Chief Complaint: OBESITY Elizabeth Gibson is here to discuss her progress with her obesity treatment plan. She is on the Category 3 plan and is following her eating plan approximately 100 % of the time. She states she is exercising 0 minutes 0 times per week. Elizabeth Gibson did very well with her diet prescription. Her hunger was controlled and she got good support from the family.  Her weight is (!) 318 lb (144.2 kg) today and has had a weight loss of 9 pounds over a period of 2 weeks since her last visit. She has lost 9 lbs since starting treatment with us.  Pre-Diabetes Elizabeth Gibson has a new diagnosis of pre-diabetes based on her elevated Hgb A1c and was informed this puts her at greater risk of developing diabetes. She did well with her diet prescription and admits polyphagia. She is not taking metformin currently and continues to work on diet and exercise to decrease risk of diabetes.   At risk for diabetes Elizabeth Gibson is at higher than average risk for developing diabetes due to her pre-diabetes and obesity. She currently denies polyuria or polydipsia.  Vitamin D deficiency Elizabeth Gibson has a new diagnosis of vitamin D deficiency and her level is very low. She admits fatigue.   Iron Deficiency Anemia Elizabeth Gibson has a diagnosis of anemia and menorrhagia. She notes fatigue and denies lightheadedness. She is not on iron supplementation.   ASSESSMENT AND PLAN:  Prediabetes - Plan: metFORMIN (GLUCOPHAGE) 500 MG tablet  Vitamin D deficiency - Plan: Vitamin D, Ergocalciferol, (DRISDOL) 1.25 MG (50000 UT) CAPS capsule  Other iron deficiency anemia  At risk for diabetes mellitus  Class 3 severe obesity with serious comorbidity and body mass index (BMI) of 50.0 to 59.9 in adult, unspecified obesity type [facility])  PLAN:  Pre-Diabetes Elizabeth Gibson will continue to work on weight loss, exercise, and decreasing simple carbohydrates in her diet to help decrease the risk of diabetes. We  discussed metformin including benefits and risks. She was informed that eating too many simple carbohydrates or too many calories at one sitting increases the likelihood of GI side effects. Elizabeth Gibson agreed to start metformin 500 mg qAM #30 with no refills and a prescription was written today. Elizabeth Gibson agreed to follow up with us as directed to monitor her progress.  Diabetes risk counseling Elizabeth Gibson was given extended (30 minutes) diabetes prevention counseling today. She is 43 y.o. female and has risk factors for diabetes including pre-diabetes and obesity. We discussed intensive lifestyle modifications today with an emphasis on weight loss as well as increasing exercise and decreasing simple carbohydrates in her diet.  Iron Deficiency Anemia Elizabeth Gibson has a diagnosis of Iron deficiency anemia was discussed with Elizabeth Gibson and was explained in detail. She was given suggestions of iron rich foods and agreed to continue her iron rich diet. Labs will be rechecked in 3 months and she will follow up as directed.  Obesity Elizabeth Gibson is currently in the action stage of change. As such, her goal is to continue with weight loss efforts. She has agreed to follow the Category 3 plan. Elizabeth Gibson has been instructed to work up to a goal of 150 minutes of combined cardio and strengthening exercise per week for weight loss and overall health benefits. We discussed the following Behavioral Modification Strategies today: increasing lean protein intake, decreasing simple carbohydrates, and work on meal planning and easy cooking plans.  Elizabeth Gibson has agreed to follow up with our clinic in 2  to 3 weeks. She was informed of the importance of frequent follow up visits to maximize her success with intensive lifestyle modifications for her multiple health conditions.  ALLERGIES: No Known Allergies  MEDICATIONS: Current Outpatient Medications on File Prior to Visit  Medication Sig Dispense Refill  . levothyroxine (SYNTHROID, LEVOTHROID) 50 MCG  tablet Take 50 mcg by mouth daily before breakfast.    . losartan-hydrochlorothiazide (HYZAAR) 50-12.5 MG tablet Take 1 tablet by mouth daily.     No current facility-administered medications on file prior to visit.     PAST MEDICAL HISTORY: Past Medical History:  Diagnosis Date  . Anxiety   . History of heart attack   . Hypertension   . Hypothyroidism   . Kidney stone   . Vitamin D deficiency     PAST SURGICAL HISTORY: Past Surgical History:  Procedure Laterality Date  . CESAREAN SECTION    . removal of kidney stones      SOCIAL HISTORY: Social History   Tobacco Use  . Smoking status: Never Smoker  . Smokeless tobacco: Never Used  Substance Use Topics  . Alcohol use: No  . Drug use: No    FAMILY HISTORY: Family History  Problem Relation Age of Onset  . Cancer Maternal Aunt        lung  . Breast cancer Maternal Aunt   . Cancer Paternal Aunt        breast  . Cancer Paternal Grandmother        breast  . Eating disorder Mother   . Hypertension Father   . Stroke Father   . Heart disease Father     ROS: Review of Systems  Constitutional: Positive for malaise/fatigue and weight loss.  Genitourinary:       Negative for polyuria.  Neurological:       Negative for lightheadedness.  Endo/Heme/Allergies: Negative for polydipsia.       Positive for polyphagia.   PHYSICAL EXAM: Blood pressure 124/84, pulse 92, temperature 97.9 F (36.6 C), temperature source Oral, height 5\' 6"  (1.676 m), weight (!) 318 lb (144.2 kg), last menstrual period 07/17/2018, SpO2 99 %. Body mass index is 51.33 kg/m. Physical Exam Vitals signs reviewed.  Constitutional:      Appearance: Normal appearance. She is obese.  Cardiovascular:     Rate and Rhythm: Normal rate.  Pulmonary:     Effort: Pulmonary effort is normal.  Musculoskeletal: Normal range of motion.  Skin:    General: Skin is warm and dry.  Neurological:     Mental Status: She is alert and oriented to person, place,  and time.  Psychiatric:        Mood and Affect: Mood normal.        Behavior: Behavior normal.     RECENT LABS AND TESTS: BMET    Component Value Date/Time   NA 140 07/14/2018 1145   K 4.4 07/14/2018 1145   CL 103 07/14/2018 1145   CO2 23 07/14/2018 1145   GLUCOSE 94 07/14/2018 1145   GLUCOSE 86 12/27/2013 0845   BUN 11 07/14/2018 1145   CREATININE 0.62 07/14/2018 1145   CALCIUM 9.3 07/14/2018 1145   GFRNONAA 112 07/14/2018 1145   GFRAA 129 07/14/2018 1145   Lab Results  Component Value Date   HGBA1C 5.7 (H) 07/14/2018   HGBA1C 5.7 12/27/2013   Lab Results  Component Value Date   INSULIN 27.3 (H) 07/14/2018   CBC    Component Value Date/Time   WBC 9.3 07/14/2018  1145   WBC 12.8 (H) 12/27/2013 0845   RBC 4.51 07/14/2018 1145   RBC 4.52 12/27/2013 0845   HGB 11.0 (L) 07/14/2018 1145   HCT 34.5 07/14/2018 1145   PLT 289.0 12/27/2013 0845   MCV 77 (L) 07/14/2018 1145   MCH 24.4 (L) 07/14/2018 1145   MCHC 31.9 07/14/2018 1145   MCHC 33.5 12/27/2013 0845   RDW 14.5 07/14/2018 1145   LYMPHSABS 2.0 07/14/2018 1145   MONOABS 0.7 12/27/2013 0845   EOSABS 0.1 07/14/2018 1145   BASOSABS 0.1 07/14/2018 1145   Iron/TIBC/Ferritin/ %Sat No results found for: IRON, TIBC, FERRITIN, IRONPCTSAT Lipid Panel     Component Value Date/Time   CHOL 191 07/14/2018 1145   TRIG 96 07/14/2018 1145   HDL 51 07/14/2018 1145   CHOLHDL 3 12/27/2013 0845   VLDL 13.6 12/27/2013 0845   LDLCALC 121 (H) 07/14/2018 1145   Hepatic Function Panel     Component Value Date/Time   PROT 7.2 07/14/2018 1145   ALBUMIN 4.4 07/14/2018 1145   AST 12 07/14/2018 1145   ALT 13 07/14/2018 1145   ALKPHOS 107 07/14/2018 1145   BILITOT 0.3 07/14/2018 1145   BILIDIR 0.1 12/27/2013 0845      Component Value Date/Time   TSH 2.310 07/14/2018 1145   TSH 1.57 12/27/2013 0845   TSH 2.36 12/08/2012 1542   Results for LASHONDRIA, SILVERWOOD (MRN [id number]) as of 08/01/2018 07:10  Ref. Range 07/14/2018 11:45    Vitamin D, 25-Hydroxy Latest Ref Range: 30.0 - 100.0 ng/mL 7.6 (L)     OBESITY BEHAVIORAL INTERVENTION VISIT  Today's visit was # 2   Starting weight: 327 lbs Starting date: 07/28/2018 Today's weight : Weight: (!) 318 lb (144.2 kg)  Today's date: 07/28/2018 Total lbs lost to date: 9  ASK: We discussed the diagnosis of obesity with Carollee Leitz today and Vasilisa agreed to give [place] permission to discuss obesity behavioral modification therapy today.  ASSESS: Drita has the diagnosis of obesity and her BMI today is 51.3. Anapaula is in the action stage of change.   ADVISE: Eshanti was educated on the multiple health risks of obesity as well as the benefit of weight loss to improve her health. She was advised of the need for long term treatment and the importance of lifestyle modifications to improve her current health and to decrease her risk of future health problems.  AGREE: Multiple dietary modification options and treatment options were discussed and Vanisha agreed to follow the recommendations documented in the above note.  ARRANGE: Erinn was educated on the importance of frequent visits to treat obesity as outlined per CMS and USPSTF guidelines and agreed to schedule her next follow up appointment today.  IKirke Corin, CMA, am acting as transcriptionist for Wilder Glade, MD  I have reviewed the above documentation for accuracy and completeness, and I agree with the above. -Quillian Quince, MD

## 2018-08-18 ENCOUNTER — Encounter (INDEPENDENT_AMBULATORY_CARE_PROVIDER_SITE_OTHER): Payer: Self-pay | Admitting: Family Medicine

## 2018-08-18 ENCOUNTER — Ambulatory Visit (INDEPENDENT_AMBULATORY_CARE_PROVIDER_SITE_OTHER): Payer: BC Managed Care – PPO | Admitting: Psychology

## 2018-08-18 ENCOUNTER — Ambulatory Visit (INDEPENDENT_AMBULATORY_CARE_PROVIDER_SITE_OTHER): Payer: BC Managed Care – PPO | Admitting: Family Medicine

## 2018-08-18 VITALS — BP 132/85 | HR 81 | Temp 97.7°F | Ht 66.0 in | Wt 314.0 lb

## 2018-08-18 DIAGNOSIS — F3289 Other specified depressive episodes: Secondary | ICD-10-CM

## 2018-08-18 DIAGNOSIS — Z9189 Other specified personal risk factors, not elsewhere classified: Secondary | ICD-10-CM | POA: Diagnosis not present

## 2018-08-18 DIAGNOSIS — Z6841 Body Mass Index (BMI) 40.0 and over, adult: Secondary | ICD-10-CM | POA: Diagnosis not present

## 2018-08-18 DIAGNOSIS — E559 Vitamin D deficiency, unspecified: Secondary | ICD-10-CM

## 2018-08-18 MED ORDER — VITAMIN D (ERGOCALCIFEROL) 1.25 MG (50000 UNIT) PO CAPS: 50000.0000 [IU] | ORAL_CAPSULE | ORAL | 0 refills | Status: DC

## 2018-08-18 NOTE — Progress Notes (Signed)
Office: [phone]  /  Fax: [phone]    Date: August 18, 2018   Time Seen: 3:27pm Duration: 30 minutes Provider: Lawerance Cruel, Psy.D. Type of Session: Individual Therapy  Type of Contact: Face-to-face  Session Content: Christana is a 43 y.o. female presenting for a follow-up appointment to address the previously established treatment goal of decreasing emotional eating. The session was initiated with the administration of the PHQ-9 and GAD-7, as well as a brief check-in. Lakeithia shared a staff member passed away. Thus, this provider assisted in processing associated thoughts and feelings. Regarding eating, Tinna reported, "It's really good." She added, "I feel so in control." She described noticing physical hunger today, but noted, "I haven't been hungry otherwise." She denied any episodes of emotional eating since the last appointment. Psychoeducation regarding triggers for emotional eating was provided. Neviyah was provided a handout, and encouraged to utilize the handout between now and the next appointment to increase awareness of triggers and frequency. [name] agreed. This provider also discussed behavioral strategies for specific triggers, such as placing the utensil down when conversing to avoid mindless eating. Kaiyana was receptive to today's session as evidenced by openness to sharing, responsiveness to feedback, and willingness to explore triggers for emotional eating.  Mental Status Examination: Nygeria arrived early for the appointment. She presented as appropriately dressed and groomed. Catelaya appeared her stated age and demonstrated adequate orientation to time, place, person, and purpose of the appointment. She also demonstrated appropriate eye contact. No psychomotor abnormalities or behavioral peculiarities noted. Her mood was euthymic with congruent affect. Her thought processes were logical, linear, and goal-directed. No hallucinations, delusions, bizarre thinking or behavior reported  or observed. Judgment, insight, and impulse control appeared to be grossly intact. There was no evidence of paraphasias (i.e., errors in speech, gross mispronunciations, and word substitutions), repetition deficits, or disturbances in volume or prosody (i.e., rhythm and intonation). There was no evidence of attention or memory impairments. Mayrene denied current suicidal and homicidal ideation, plan and intent.   Structured Assessment Results: The Patient Health Questionnaire-9 (PHQ-9) is a self-report measure that assesses symptoms and severity of depression over the course of the last two weeks. Shakela obtained a score of zero. Depression screen [facility] 2/9 08/18/2018  Decreased Interest 0  Down, Depressed, Hopeless 0  PHQ - 2 Score 0  Altered sleeping 0  Tired, decreased energy 0  Change in appetite 0  Feeling bad or failure about yourself  0  Trouble concentrating 0  Moving slowly or fidgety/restless 0  Suicidal thoughts 0  PHQ-9 Score 0  Difficult doing work/chores -   The Generalized Anxiety Disorder-7 (GAD-7) is a brief self-report measure that assesses symptoms of anxiety over the course of the last two weeks. Preslei obtained a score of zero. GAD 7 : Generalized Anxiety Score 08/18/2018  Nervous, Anxious, on Edge 0  Control/stop worrying 0  Worry too much - different things 0  Trouble relaxing 0  Restless 0  Easily annoyed or irritable 0  Afraid - awful might happen 0  Total GAD 7 Score 0   Interventions:  Administration of PHQ-9 and GAD-7 for symptom monitoring Review of content from the previous session Empathic reflections and validation Processing thoughts and feelings Psychoeducation regarding triggers for emotional eating Positive reinforcement Rapport building Brief chart review  DSM-5 Diagnosis: 311 (F32.8) Other Specified Depressive Disorder, Emotional Eating Behaviors  Treatment Goal & Progress: During the initial appointment with this provider, the following treatment  goal was established: decrease emotional eating. Progress  is limited, as Aubriona has just begun treatment with this provider; however, she is receptive to the interaction and interventions and rapport is being established. Nevertheless, Regeana has demonstrated progress in her goal as evidenced by increased awareness of hunger patterns.   Plan: Elexa continues to appear able and willing to participate as evidenced by engagement in reciprocal conversation, and asking questions for clarification as appropriate. The next appointment will be scheduled in two weeks. The next session will focus on reviewing triggers for emotional eating, and working towards the established treatment goal.

## 2018-08-22 NOTE — Progress Notes (Signed)
Office: [phone]  /  Fax: [phone]   HPI:   Chief Complaint: OBESITY Elizabeth Gibson is here to discuss her progress with her obesity treatment plan. She is on the Category 3 plan and is following her eating plan approximately 99 % of the time. She states she is exercising 0 minutes 0 times per week. Elizabeth Gibson continues to do well with weight loss on the Category 3 plan even with traveling and increased eating out. She is making good choices and her hunger is controlled.  Her weight is (!) 314 lb (142.4 kg) today and has had a weight loss of 4 pounds over a period of 3 weeks since her last visit. She has lost 13 lbs since starting treatment with [place].  Vitamin D deficiency Elizabeth Gibson has a diagnosis of vitamin D deficiency. She is currently stable on vit D and denies nausea, vomiting, or muscle weakness.  At risk for osteopenia and osteoporosis Elizabeth Gibson is at higher risk of osteopenia and osteoporosis due to vitamin D deficiency.   ASSESSMENT AND PLAN:  Vitamin D deficiency - Plan: Vitamin D, Ergocalciferol, (DRISDOL) 1.25 MG (50000 UT) CAPS capsule  At risk for osteoporosis  Class 3 severe obesity with serious comorbidity and body mass index (BMI) of 50.0 to 59.9 in adult, unspecified obesity type (HCC)  PLAN:  Vitamin D Deficiency Elizabeth Gibson was informed that low vitamin D levels contributes to fatigue and are associated with obesity, breast, and colon cancer. Elizabeth Gibson agrees to continue to take prescription Vit D @50 ,000 IU every week #4 with no refills and will follow up for routine testing of vitamin D, at least 2-3 times per year. She was informed of the risk of over-replacement of vitamin D and agrees to not increase her dose unless she discusses this with [place] first. Elizabeth Gibson agrees to follow up in 2 to 3 weeks as directed.  At risk for osteopenia and osteoporosis Elizabeth Gibson was given extended (15 minutes) osteoporosis prevention counseling today. Elizabeth Gibson is at risk for osteopenia and osteoporosis due to her  vitamin D deficiency. She was encouraged to take her vitamin D and follow her higher calcium diet and increase strengthening exercise to help strengthen her bones and decrease her risk of osteopenia and osteoporosis.  Obesity Elizabeth Gibson is currently in the action stage of change. As such, her goal is to continue with weight loss efforts. She has agreed to follow the Category 3 plan. Elizabeth Gibson has been instructed to work up to a goal of 150 minutes of combined cardio and strengthening exercise per week for weight loss and overall health benefits. We discussed the following Behavioral Modification Strategies today: decrease eating out and work on meal planning and easy cooking plans.  Elizabeth Gibson has agreed to follow up with our clinic in 2 to 3 weeks. She was informed of the importance of frequent follow up visits to maximize her success with intensive lifestyle modifications for her multiple health conditions.  ALLERGIES: No Known Allergies  MEDICATIONS: Current Outpatient Medications on File Prior to Visit  Medication Sig Dispense Refill  . levothyroxine (SYNTHROID, LEVOTHROID) 50 MCG tablet Take 50 mcg by mouth daily before breakfast.    . losartan-hydrochlorothiazide (HYZAAR) 50-12.5 MG tablet Take 1 tablet by mouth daily.    . metFORMIN (GLUCOPHAGE) 500 MG tablet Take 1 tablet (500 mg total) by mouth daily with breakfast. (Patient not taking: Reported on 08/18/2018) 30 tablet 0   No current facility-administered medications on file prior to visit.     PAST MEDICAL HISTORY: Past Medical  History:  Diagnosis Date  . Anxiety   . History of heart attack   . Hypertension   . Hypothyroidism   . Kidney stone   . Vitamin D deficiency     PAST SURGICAL HISTORY: Past Surgical History:  Procedure Laterality Date  . CESAREAN SECTION    . removal of kidney stones      SOCIAL HISTORY: Social History   Tobacco Use  . Smoking status: Never Smoker  . Smokeless tobacco: Never Used  Substance Use  Topics  . Alcohol use: No  . Drug use: No    FAMILY HISTORY: Family History  Problem Relation Age of Onset  . Cancer Maternal Aunt        lung  . Breast cancer Maternal Aunt   . Cancer Paternal Aunt        breast  . Cancer Paternal Grandmother        breast  . Eating disorder Mother   . Hypertension Father   . Stroke Father   . Heart disease Father     ROS: Review of Systems  Constitutional: Positive for weight loss.  Gastrointestinal: Negative for nausea and vomiting.  Musculoskeletal:       Negative for muscle weakness.    PHYSICAL EXAM: Blood pressure 132/85, pulse 81, temperature 97.7 F (36.5 C), temperature source Oral, height 5\' 6"  (1.676 m), weight (!) 314 lb (142.4 kg), last menstrual period 08/14/2018, SpO2 97 %. Body mass index is 50.68 kg/m. Physical Exam Vitals signs reviewed.  Constitutional:      Appearance: Normal appearance. She is obese.  Cardiovascular:     Rate and Rhythm: Normal rate.  Pulmonary:     Effort: Pulmonary effort is normal.  Musculoskeletal: Normal range of motion.  Skin:    General: Skin is warm and dry.  Neurological:     Mental Status: She is alert and oriented to person, place, and time.  Psychiatric:        Mood and Affect: Mood normal.        Behavior: Behavior normal.     RECENT LABS AND TESTS: BMET    Component Value Date/Time   NA 140 07/14/2018 1145   K 4.4 07/14/2018 1145   CL 103 07/14/2018 1145   CO2 23 07/14/2018 1145   GLUCOSE 94 07/14/2018 1145   GLUCOSE 86 12/27/2013 0845   BUN 11 07/14/2018 1145   CREATININE 0.62 07/14/2018 1145   CALCIUM 9.3 07/14/2018 1145   GFRNONAA 112 07/14/2018 1145   GFRAA 129 07/14/2018 1145   Lab Results  Component Value Date   HGBA1C 5.7 (H) 07/14/2018   HGBA1C 5.7 12/27/2013   Lab Results  Component Value Date   INSULIN 27.3 (H) 07/14/2018   CBC    Component Value Date/Time   WBC 9.3 07/14/2018 1145   WBC 12.8 (H) 12/27/2013 0845   RBC 4.51 07/14/2018 1145     RBC 4.52 12/27/2013 0845   HGB 11.0 (L) 07/14/2018 1145   HCT 34.5 07/14/2018 1145   PLT 289.0 12/27/2013 0845   MCV 77 (L) 07/14/2018 1145   MCH 24.4 (L) 07/14/2018 1145   MCHC 31.9 07/14/2018 1145   MCHC 33.5 12/27/2013 0845   RDW 14.5 07/14/2018 1145   LYMPHSABS 2.0 07/14/2018 1145   MONOABS 0.7 12/27/2013 0845   EOSABS 0.1 07/14/2018 1145   BASOSABS 0.1 07/14/2018 1145   Iron/TIBC/Ferritin/ %Sat No results found for: IRON, TIBC, FERRITIN, IRONPCTSAT Lipid Panel     Component Value Date/Time  CHOL 191 07/14/2018 1145   TRIG 96 07/14/2018 1145   HDL 51 07/14/2018 1145   CHOLHDL 3 12/27/2013 0845   VLDL 13.6 12/27/2013 0845   LDLCALC 121 (H) 07/14/2018 1145   Hepatic Function Panel     Component Value Date/Time   PROT 7.2 07/14/2018 1145   ALBUMIN 4.4 07/14/2018 1145   AST 12 07/14/2018 1145   ALT 13 07/14/2018 1145   ALKPHOS 107 07/14/2018 1145   BILITOT 0.3 07/14/2018 1145   BILIDIR 0.1 12/27/2013 0845      Component Value Date/Time   TSH 2.310 07/14/2018 1145   TSH 1.57 12/27/2013 0845   TSH 2.36 12/08/2012 1542   Results for MILLIANI, HERRADA (MRN [id number]) as of 08/22/2018 09:46  Ref. Range 07/14/2018 11:45  Vitamin D, 25-Hydroxy Latest Ref Range: 30.0 - 100.0 ng/mL 7.6 (L)    OBESITY BEHAVIORAL INTERVENTION VISIT  Today's visit was # 3   Starting weight: 327 lbs Starting date: 07/04/18 Today's weight : Weight: (!) 314 lb (142.4 kg)  Today's date: 08/18/2018 Total lbs lost to date: 13   08/18/2018  Height  (1.676 m)  Weight 314 lb (142.4 kg) (A)  BMI (Calculated) 50.7  BLOOD PRESSURE - SYSTOLIC 132  BLOOD PRESSURE - DIASTOLIC 85   Body Fat % 51.9 %  Total Body Water (lbs) 109 lbs    ASK: We discussed the diagnosis of obesity with Elizabeth Gibson today and Elizabeth Gibson agreed to give [place] permission to discuss obesity behavioral modification therapy today.  ASSESS: Yvett has the diagnosis of obesity and her BMI today is 50.7. Shanese is in the action  stage of change.   ADVISE: Bethan was educated on the multiple health risks of obesity as well as the benefit of weight loss to improve her health. She was advised of the need for long term treatment and the importance of lifestyle modifications to improve her current health and to decrease her risk of future health problems.  AGREE: Multiple dietary modification options and treatment options were discussed and Kellsey agreed to follow the recommendations documented in the above note.  ARRANGE: Shermeka was educated on the importance of frequent visits to treat obesity as outlined per CMS and USPSTF guidelines and agreed to schedule her next follow up appointment today.  IKirke Corin, CMA, am acting as transcriptionist for Wilder Glade, MD  I have reviewed the above documentation for accuracy and completeness, and I agree with the above. -Quillian Quince, MD

## 2018-09-05 NOTE — Progress Notes (Signed)
Office: [phone]  /  Fax: [phone]    Date: 09/07/2018   Time Seen: 4:00pm Duration: 25 minutes Provider: Lawerance Cruel, Psy.D. Type of Session: Individual Therapy  Type of Contact: Face-to-face  Session Content: Elizabeth Gibson is a 43 y.o. female presenting for a follow-up appointment to address the previously established treatment goal of decreasing emotional eating. The session was initiated with the administration of the PHQ-9 and GAD-7, as well as a brief check-in. Elizabeth Gibson reported, "Things are going well." Regarding the clinic, Elizabeth Gibson stated, "I feel good about it. I feel I'm in control." She discussed using previously discussed strategies (e.g., checking the menu beforehand) when going out to eat with friends. During today's appointment with Dr. Dalbert Garnet, Elizabeth Gibson reported, "I lost 7 pounds." Since the last appointment with this provider, Elizabeth Gibson denied episodes of emotional eating. Psychoeducation regarding mindfulness was provided. A handout was provided to Elizabeth Gibson with further information regarding mindfulness, including exercises. This provider also explained the benefit of mindfulness as it relates to emotional eating. Elizabeth Gibson was encouraged to engage in the provided exercises between now and the next appointment with this provider. Elizabeth Gibson agreed. She was led through a mindfulness exercise involving her senses. Elizabeth Gibson was receptive to today's session as evidenced by openness to sharing, responsiveness to feedback, and engagement in a mindfulness exercise.  Mental Status Examination: Elizabeth Gibson arrived on time for the appointment. She presented as appropriately dressed and groomed. Elizabeth Gibson appeared her stated age and demonstrated adequate orientation to time, place, person, and purpose of the appointment. She also demonstrated appropriate eye contact. No psychomotor abnormalities or behavioral peculiarities noted. Her mood was euthymic with congruent affect. Her thought processes were logical, linear, and  goal-directed. No hallucinations, delusions, bizarre thinking or behavior reported or observed. Judgment, insight, and impulse control appeared to be grossly intact. There was no evidence of paraphasias (i.e., errors in speech, gross mispronunciations, and word substitutions), repetition deficits, or disturbances in volume or prosody (i.e., rhythm and intonation). There was no evidence of attention or memory impairments. Elizabeth Gibson denied current suicidal and homicidal ideation, plan and intent.   Structured Assessment Results: The Patient Health Questionnaire-9 (PHQ-9) is a self-report measure that assesses symptoms and severity of depression over the course of the last two weeks. Elizabeth Gibson obtained a score of zero. Depression screen [facility] 2/9 09/08/2018  Decreased Interest 0  Down, Depressed, Hopeless 0  PHQ - 2 Score 0  Altered sleeping 0  Tired, decreased energy 0  Change in appetite 0  Feeling bad or failure about yourself  0  Trouble concentrating 0  Moving slowly or fidgety/restless 0  Suicidal thoughts 0  PHQ-9 Score 0  Difficult doing work/chores -   The Generalized Anxiety Disorder-7 (GAD-7) is a brief self-report measure that assesses symptoms of anxiety over the course of the last two weeks. Elizabeth Gibson obtained a score of zero. GAD 7 : Generalized Anxiety Score 09/08/2018  Nervous, Anxious, on Edge 0  Control/stop worrying 0  Worry too much - different things 0  Trouble relaxing 0  Restless 0  Easily annoyed or irritable 0  Afraid - awful might happen 0  Total GAD 7 Score 0   Interventions:  Administration of PHQ-9 and GAD-7 for symptom monitoring Review of content from the previous session Empathic reflections and validation Psychoeducation regarding mindfulness Mindfulness exercise Positive reinforcement Brief chart review  DSM-5 Diagnosis: 311 (F32.8) Other Specified Depressive Disorder, Emotional Eating Behaviors  Treatment Goal & Progress: During the initial appointment with  this provider, the following treatment goal  was established: decrease emotional eating. Mekiah has demonstrated progress in her goal as evidenced by increased awareness of hunger patterns, and triggers for emotional eating. Since the last appointment with this provider, Johnisha denied any episodes of emotional eating. She also discussed utilizing learned skills.   Plan: Glennda continues to appear able and willing to participate as evidenced by engagement in reciprocal conversation, and asking questions for clarification as appropriate. In order to coordinate appointments with the clinic, the next appointment will be scheduled in three weeks. The next session will focus further on mindfulness.

## 2018-09-08 ENCOUNTER — Other Ambulatory Visit: Payer: Self-pay

## 2018-09-08 ENCOUNTER — Ambulatory Visit (INDEPENDENT_AMBULATORY_CARE_PROVIDER_SITE_OTHER): Payer: BC Managed Care – PPO | Admitting: Family Medicine

## 2018-09-08 ENCOUNTER — Encounter (INDEPENDENT_AMBULATORY_CARE_PROVIDER_SITE_OTHER): Payer: Self-pay | Admitting: Family Medicine

## 2018-09-08 ENCOUNTER — Ambulatory Visit (INDEPENDENT_AMBULATORY_CARE_PROVIDER_SITE_OTHER): Payer: BC Managed Care – PPO | Admitting: Psychology

## 2018-09-08 VITALS — BP 114/70 | HR 68 | Ht 66.0 in | Wt 307.0 lb

## 2018-09-08 DIAGNOSIS — Z9189 Other specified personal risk factors, not elsewhere classified: Secondary | ICD-10-CM | POA: Diagnosis not present

## 2018-09-08 DIAGNOSIS — R7303 Prediabetes: Secondary | ICD-10-CM | POA: Diagnosis not present

## 2018-09-08 DIAGNOSIS — F3289 Other specified depressive episodes: Secondary | ICD-10-CM | POA: Diagnosis not present

## 2018-09-08 DIAGNOSIS — E559 Vitamin D deficiency, unspecified: Secondary | ICD-10-CM

## 2018-09-08 DIAGNOSIS — Z6841 Body Mass Index (BMI) 40.0 and over, adult: Secondary | ICD-10-CM

## 2018-09-08 MED ORDER — VITAMIN D (ERGOCALCIFEROL) 1.25 MG (50000 UNIT) PO CAPS: 50000.0000 [IU] | ORAL_CAPSULE | ORAL | 0 refills | Status: DC

## 2018-09-08 NOTE — Progress Notes (Signed)
Office: [phone]  /  Fax: [phone]   HPI:   Chief Complaint: OBESITY Elizabeth Gibson is here to discuss her progress with her obesity treatment plan. She is on the Category 3 plan and is following her eating plan approximately 98 to 99 % of the time. She states she is exercising 0 minutes 0 times per week. [name] continues to do well with weight loss on her Category 3 plan. She is making some substitutions, but making good ones overall. Her hunger is controlled.  Her weight is (!) 307 lb (139.3 kg) today and has had a weight loss of 7 pounds over a period of 3 weeks since her last visit. She has lost 20 lbs since starting treatment with [place].  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 She is currently stable on vit D. Breawna denies nausea, vomiting, or muscle weakness.  Pre-Diabetes Elizabeth Gibson has a diagnosis of pre-diabetes based on her elevated Hgb A1c of 5.7 on 07/14/18 and was informed this puts her at greater risk of developing diabetes. She has not started taking metformin yet as she is not yet on birth control. She is doing well with weight loss however and continues to work on diet and exercise to decrease risk of diabetes.    At risk for diabetes Elizabeth Gibson is at higher than average risk for developing diabetes due to her pre-diabetes and obesity. She currently denies polyuria or polydipsia.  ASSESSMENT AND PLAN:  Vitamin D deficiency - Plan: Vitamin D, Ergocalciferol, (DRISDOL) 1.25 MG (50000 UT) CAPS capsule  Prediabetes  At risk for diabetes mellitus  Class 3 severe obesity with serious comorbidity and body mass index (BMI) of 45.0 to 49.9 in adult, unspecified obesity type (HCC)  PLAN:  Vitamin D Deficiency Elizabeth Gibson was informed that low vitamin D levels contributes to fatigue and are associated with obesity, breast, and colon cancer. Elizabeth Gibson agrees to continue to take prescription Vit D @50 ,000 IU every week #4 with no refills and will follow up for routine  testing of vitamin D, at least 2-3 times per year. She was informed of the risk of over-replacement of vitamin D and agrees to not increase her dose unless she discusses this with [place] first. Elizabeth Gibson agrees to follow up in 2 to 3 weeks as directed.  Pre-Diabetes Elizabeth Gibson will continue to work on weight loss, exercise, and decreasing simple carbohydrates in her diet to help decrease the risk of diabetes. She was informed that eating too many simple carbohydrates or too many calories at one sitting increases the likelihood of GI side effects. Elizabeth Gibson agreed to start metformin after her IUD is placed and a prescription was not written today. Elizabeth Gibson agreed to continue with diet and exercise and to follow up with [place] as directed to monitor her progress.   Diabetes risk counseling Elizabeth Gibson was given extended (15 minutes) diabetes prevention counseling today. She is 43 y.o. female and has risk factors for diabetes including pre-diabetes and obesity. We discussed intensive lifestyle modifications today with an emphasis on weight loss as well as increasing exercise and decreasing simple carbohydrates in her diet.  Obesity Elizabeth Gibson is currently in the action stage of change. As such, her goal is to continue with weight loss efforts. She has agreed to follow the Category 3 plan. Elizabeth Gibson has been instructed to work up to a goal of 150 minutes of combined cardio and strengthening exercise per week for weight loss and overall health benefits. We discussed the following Behavioral Modification Strategies  today: increasing lean protein intake, decreasing simple carbohydrates, and work on meal planning and easy cooking plans.  Elizabeth Gibson has agreed to follow up with our clinic in 2 to 3 weeks. She was informed of the importance of frequent follow up visits to maximize her success with intensive lifestyle modifications for her multiple health conditions.  ALLERGIES: No Known Allergies  MEDICATIONS: Current Outpatient Medications on File  Prior to Visit  Medication Sig Dispense Refill  . levothyroxine (SYNTHROID, LEVOTHROID) 50 MCG tablet Take 50 mcg by mouth daily before breakfast.    . losartan-hydrochlorothiazide (HYZAAR) 50-12.5 MG tablet Take 1 tablet by mouth daily.    . metFORMIN (GLUCOPHAGE) 500 MG tablet Take 1 tablet (500 mg total) by mouth daily with breakfast. 30 tablet 0  . Vitamin D, Ergocalciferol, (DRISDOL) 1.25 MG (50000 UT) CAPS capsule Take 1 capsule (50,000 Units total) by mouth every 7 (seven) days. 4 capsule 0   No current facility-administered medications on file prior to visit.     PAST MEDICAL HISTORY: Past Medical History:  Diagnosis Date  . Anxiety   . History of heart attack   . Hypertension   . Hypothyroidism   . Kidney stone   . Vitamin D deficiency     PAST SURGICAL HISTORY: Past Surgical History:  Procedure Laterality Date  . CESAREAN SECTION    . removal of kidney stones      SOCIAL HISTORY: Social History   Tobacco Use  . Smoking status: Never Smoker  . Smokeless tobacco: Never Used  Substance Use Topics  . Alcohol use: No  . Drug use: No    FAMILY HISTORY: Family History  Problem Relation Age of Onset  . Cancer Maternal Aunt        lung  . Breast cancer Maternal Aunt   . Cancer Paternal Aunt        breast  . Cancer Paternal Grandmother        breast  . Eating disorder Mother   . Hypertension Father   . Stroke Father   . Heart disease Father    ROS: Review of Systems  Constitutional: Positive for weight loss.  Gastrointestinal: Negative for nausea and vomiting.  Genitourinary:       Negative for polyuria.  Musculoskeletal:       Negative for muscle weakness.  Endo/Heme/Allergies: Negative for polydipsia.   PHYSICAL EXAM: Blood pressure 114/70, pulse 68, height  (1.676 m), weight (!) 307 lb (139.3 kg), last menstrual period 09/08/2018, SpO2 98 %. Body mass index is 49.55 kg/m. Physical Exam Vitals signs reviewed.  Constitutional:       Appearance: Normal appearance. She is obese.  Cardiovascular:     Rate and Rhythm: Normal rate.  Pulmonary:     Effort: Pulmonary effort is normal.  Musculoskeletal: Normal range of motion.  Skin:    General: Skin is warm and dry.  Neurological:     Mental Status: She is alert and oriented to person, place, and time.  Psychiatric:        Mood and Affect: Mood normal.        Behavior: Behavior normal.    RECENT LABS AND TESTS: BMET    Component Value Date/Time   NA 140 07/14/2018 1145   K 4.4 07/14/2018 1145   CL 103 07/14/2018 1145   CO2 23 07/14/2018 1145   GLUCOSE 94 07/14/2018 1145   GLUCOSE 86 12/27/2013 0845   BUN 11 07/14/2018 1145   CREATININE 0.62 07/14/2018 1145  CALCIUM 9.3 07/14/2018 1145   GFRNONAA 112 07/14/2018 1145   GFRAA 129 07/14/2018 1145   Lab Results  Component Value Date   HGBA1C 5.7 (H) 07/14/2018   HGBA1C 5.7 12/27/2013   Lab Results  Component Value Date   INSULIN 27.3 (H) 07/14/2018   CBC    Component Value Date/Time   WBC 9.3 07/14/2018 1145   WBC 12.8 (H) 12/27/2013 0845   RBC 4.51 07/14/2018 1145   RBC 4.52 12/27/2013 0845   HGB 11.0 (L) 07/14/2018 1145   HCT 34.5 07/14/2018 1145   PLT 289.0 12/27/2013 0845   MCV 77 (L) 07/14/2018 1145   MCH 24.4 (L) 07/14/2018 1145   MCHC 31.9 07/14/2018 1145   MCHC 33.5 12/27/2013 0845   RDW 14.5 07/14/2018 1145   LYMPHSABS 2.0 07/14/2018 1145   MONOABS 0.7 12/27/2013 0845   EOSABS 0.1 07/14/2018 1145   BASOSABS 0.1 07/14/2018 1145   Iron/TIBC/Ferritin/ %Sat No results found for: IRON, TIBC, FERRITIN, IRONPCTSAT Lipid Panel     Component Value Date/Time   CHOL 191 07/14/2018 1145   TRIG 96 07/14/2018 1145   HDL 51 07/14/2018 1145   CHOLHDL 3 12/27/2013 0845   VLDL 13.6 12/27/2013 0845   LDLCALC 121 (H) 07/14/2018 1145   Hepatic Function Panel     Component Value Date/Time   PROT 7.2 07/14/2018 1145   ALBUMIN 4.4 07/14/2018 1145   AST 12 07/14/2018 1145   ALT 13 07/14/2018  1145   ALKPHOS 107 07/14/2018 1145   BILITOT 0.3 07/14/2018 1145   BILIDIR 0.1 12/27/2013 0845      Component Value Date/Time   TSH 2.310 07/14/2018 1145   TSH 1.57 12/27/2013 0845   TSH 2.36 12/08/2012 1542   Results for TIANNA, BAUS (MRN [id number]) as of 09/08/2018 16:10  Ref. Range 07/14/2018 11:45  Vitamin D, 25-Hydroxy Latest Ref Range: 30.0 - 100.0 ng/mL 7.6 (L)   OBESITY BEHAVIORAL INTERVENTION VISIT  Today's visit was # 4  Starting weight: 327 lbs Starting date: 07/14/18 Today's weight : Weight: (!) 307 lb (139.3 kg)  Today's date: 09/08/2018 Total lbs lost to date: 20   09/08/2018  Height  (1.676 m)  Weight 307 lb (139.3 kg) (A)  BMI (Calculated) 49.57  BLOOD PRESSURE - SYSTOLIC 114  BLOOD PRESSURE - DIASTOLIC 70   Body Fat % 51.1 %  Total Body Water (lbs) 109 lbs   ASK: We discussed the diagnosis of obesity with Carollee Leitz today and Neomi agreed to give [place] permission to discuss obesity behavioral modification therapy today.  ASSESS: Shernell has the diagnosis of obesity and her BMI today is 49.57. Amarys is in the action stage of change.   ADVISE: Maghan was educated on the multiple health risks of obesity as well as the benefit of weight loss to improve her health. She was advised of the need for long term treatment and the importance of lifestyle modifications to improve her current health and to decrease her risk of future health problems.  AGREE: Multiple dietary modification options and treatment options were discussed and Sarayah agreed to follow the recommendations documented in the above note.  ARRANGE: Lynnea was educated on the importance of frequent visits to treat obesity as outlined per CMS and USPSTF guidelines and agreed to schedule her next follow up appointment today.  IKirke Corin, CMA, am acting as transcriptionist for Wilder Glade, MD  I have reviewed the above documentation for accuracy and completeness, and I agree with the  above.  -Quillian Quince, MD

## 2018-09-21 ENCOUNTER — Encounter (INDEPENDENT_AMBULATORY_CARE_PROVIDER_SITE_OTHER): Payer: Self-pay

## 2018-09-28 NOTE — Progress Notes (Signed)
Office: [phone]  /  Fax: [phone]    Date: September 29, 2018  Appointment Start Time: 11:03am Duration: 26 minutes Provider: Lawerance Cruel, Psy.D. Type of Session: Individual Therapy  Location of Patient: Work Restaurant manager, fast food of Provider: Office  Type of Contact: Telepsychological Visit via [employer]   Session Content:Prior to initiating telepsychological services, Elizabeth Gibson was provided with an informed consent document via e-mail, which included the development of a safety plan (I.e., an emergency contact and emergency resources) in the event of an emergency/crisis. Elizabeth Gibson was unable to complete the form and return it to this provider prior to today's appointment. As such, this provider verbally discussed the consent form during today's appointment. Elizabeth Gibson provided an emergency contact, and verbally acknowledged understanding that she is ultimately responsible for understanding her insurance benefits as it relates to reimbursement of telepsychological services. This provider also reviewed confidentiality, as it relates to telepsychological services, as well as the rationale for telepsychological services. More specifically, this provider's clinic is closed for in-person visits due to COVID-19. Therapeutic services will resume to in-person appointments once the clinic re-opens. Elizabeth Gibson expressed understanding regarding the rationale for telepsychological services. In addition, this provider explained the telepsychological services informed consent document would be considered an addendum to the initial consent document. Elizabeth Gibson verbally consented to proceed. She noted a plan to return the completed consent form via MyChart. Regarding MyChart messages, Elizabeth Gibson verbally acknowledged understanding that any messages sent would be a part of her electronic medical record; therefore, visible to all providers.     Elizabeth Gibson is a 43 y.o. female presenting via [employer] for a follow-up appointment to address the  previously established treatment goal of decreasing emotional eating. Prior to proceeding with today's appointment, Elizabeth Gibson's physical location at the time of this appointment was obtained. Elizabeth Gibson reported she was at work in her office, and provided the address. In the event of technical difficulties, Elizabeth Gibson shared a phone number she could be reached at. Elizabeth Gibson and this provider participated in today's telepsychological service. Also, Elizabeth Gibson denied anyone else being present in the room or on the [employer]. Elizabeth Gibson reported she has "been busy," but noted "it has slowed down this week." Due to the changes in work schedule secondary to COVID-19, Elizabeth Gibson described an increase in family time as well as physical activity. Regarding emotional eating, she acknowledged experiencing emotional hunger secondary to boredom; however, she explained that she tries to walk instead of eating. She acknowledged one episode of emotional eating during which she ate an additional bag of [employer]. Since onset of treatment with this provider, Elizabeth Gibson reported an overall reduction in emotional eating. Regarding mindfulness, she shared she has been engaging in the provided exercises daily and described it as helping. She also shared that she has observed an increase in being present with her family. This provider discussed the utilization of YouTube for mindfulness exercises, specifically exercises by Rhae Hammock. Moreover, this provider discussed an additional mindfulness exercise, A Taste of Mindfulness. This provider sent Elizabeth Gibson a MyChart message with a handout for a mindfulness exercise. Prior to sending the message, this provider explained the message would be visible to all providers, as it would be part of the electronic medical record. Elizabeth Gibson verbally acknowledged understanding, and verbally consented to this provider sending the MyChart message. This provider also discussed termination planning, including the option for a referral  for longer-term therapeutic services and the change in frequency of appointments. Elizabeth Gibson was receptive to today's session as evidenced by openness to  sharing, responsiveness to feedback, and willingness to continue engaging in mindfulness exercises.  Mental Status Examination:  Appearance: neat Behavior: cooperative Mood: euthymic Affect: mood congruent Speech: normal in rate, volume, and tone Eye Contact: appropriate Psychomotor Activity: appropriate Thought Process: linear, logical, and goal directed  Content/Perceptual Disturbances: denies suicidal and homicidal ideation, plan, and intent and no hallucinations, delusions, bizarre thinking or behavior reported or observed Orientation: time, person, place and purpose of appointment Cognition/Sensorium: memory, attention, language, and fund of knowledge intact  Insight: good Judgment: good  Interventions:  Review of content from the previous session Empathic reflections and validation Further psychoeducation regarding mindfulness Termination planning Positive reinforcement Brief chart review  DSM-5 Diagnosis: 311 (F32.8) Other Specified Depressive Disorder, Emotional Eating Behaviors  Treatment Goal & Progress: During the initial appointment with this provider, the following treatment goal was established: decrease emotional eating. Elizabeth Gibson has demonstrated progress in her goal as evidenced by increased awareness of hunger and triggers for emotional eating. Since the last appointment with this provider, she noted 1 episode of emotional eating and described an overall reduction since the onset of treatment with this provider.  Plan: Elizabeth Gibson continues to appear able and willing to participate as evidenced by engagement in reciprocal conversation, and asking questions for clarification as appropriate. The next appointment will be scheduled in one month, which will be via [employer]. Once this provider's office resumes in-person appointments,  Elizabeth Gibson will be notified. The next session will focus on the introduction of thought defusion

## 2018-09-29 ENCOUNTER — Encounter (INDEPENDENT_AMBULATORY_CARE_PROVIDER_SITE_OTHER): Payer: Self-pay

## 2018-09-29 ENCOUNTER — Encounter (INDEPENDENT_AMBULATORY_CARE_PROVIDER_SITE_OTHER): Payer: Self-pay | Admitting: Family Medicine

## 2018-09-29 ENCOUNTER — Ambulatory Visit (INDEPENDENT_AMBULATORY_CARE_PROVIDER_SITE_OTHER): Payer: BC Managed Care – PPO | Admitting: Psychology

## 2018-09-29 ENCOUNTER — Ambulatory Visit (INDEPENDENT_AMBULATORY_CARE_PROVIDER_SITE_OTHER): Payer: BC Managed Care – PPO | Admitting: Family Medicine

## 2018-09-29 ENCOUNTER — Other Ambulatory Visit: Payer: Self-pay

## 2018-09-29 DIAGNOSIS — F3289 Other specified depressive episodes: Secondary | ICD-10-CM | POA: Diagnosis not present

## 2018-09-29 DIAGNOSIS — Z6841 Body Mass Index (BMI) 40.0 and over, adult: Secondary | ICD-10-CM

## 2018-09-29 DIAGNOSIS — E559 Vitamin D deficiency, unspecified: Secondary | ICD-10-CM

## 2018-09-29 MED ORDER — VITAMIN D (ERGOCALCIFEROL) 1.25 MG (50000 UNIT) PO CAPS: 50000.0000 [IU] | ORAL_CAPSULE | ORAL | 0 refills | Status: DC

## 2018-09-29 NOTE — Progress Notes (Signed)
Office: [phone]  /  Fax: [phone] TeleHealth Visit:  Elizabeth Gibson has verbally consented to this TeleHealth visit today. The patient is located at work, the provider is located at the [employer] [facility] office. The participants in this visit include the listed provider and patient. The visit was conducted today via Face Time.  HPI:   Chief Complaint: OBESITY Elizabeth Gibson is here to discuss her progress with her obesity treatment plan. She is on the Category 3 plan and is following her eating plan approximately 98 % of the time. She states she is walking 2 to 3 miles for 40 to 55 minutes 5 times per week. Elizabeth Gibson has done well on her plan even through COVID19 isolation. Her hunger is controlled. She has had increased temptations from her family recently, but has done well avoiding the temptations. Her mood is good and she has increased exercise in the last 2 weeks.  We were unable to weigh the patient today for this TeleHealth visit. She feels as if she has lost weight since her last visit. She has lost 20 lbs since starting treatment with [place].  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 She is currently stable on vit D.  ASSESSMENT AND PLAN:  Vitamin D deficiency - Plan: Vitamin D, Ergocalciferol, (DRISDOL) 1.25 MG (50000 UT) CAPS capsule  Class 3 severe obesity with serious comorbidity and body mass index (BMI) of 45.0 to 49.9 in adult, unspecified obesity type (HCC)  PLAN:  Vitamin D Deficiency Hannaley was informed that low vitamin D levels contribute to fatigue and are associated with obesity, breast, and colon cancer. [name] agrees to continue to take prescription Vit D @50 ,000 IU every week #4 with no refills and will follow up for routine testing of vitamin D, at least 2-3 times per year. She was informed of the risk of over-replacement of vitamin D and agrees to not increase her dose unless she discusses this with [place] first. Elizabeth Gibson agrees to follow up in 2  weeks as directed.  Obesity Elizabeth Gibson is currently in the action stage of change. As such, her goal is to continue with weight loss efforts. She has agreed to follow the Category 3 plan. Schnell has been instructed to work up to a goal of 150 minutes of combined cardio and strengthening exercise per week for weight loss and overall health benefits. We discussed the following Behavioral Modification Strategies today: work on meal planning and easy cooking plans, dealing with family or coworker sabotage, ways to avoid boredom eating, keeping healthy foods in the home, and ways to avoid night time snacking.  Elizabeth Gibson has agreed to follow up with our clinic in 2 weeks. She was informed of the importance of frequent follow up visits to maximize her success with intensive lifestyle modifications for her multiple health conditions.  ALLERGIES: No Known Allergies  MEDICATIONS: Current Outpatient Medications on File Prior to Visit  Medication Sig Dispense Refill  . levothyroxine (SYNTHROID, LEVOTHROID) 50 MCG tablet Take 50 mcg by mouth daily before breakfast.    . losartan-hydrochlorothiazide (HYZAAR) 50-12.5 MG tablet Take 1 tablet by mouth daily.    . metFORMIN (GLUCOPHAGE) 500 MG tablet Take 1 tablet (500 mg total) by mouth daily with breakfast. 30 tablet 0   No current facility-administered medications on file prior to visit.     PAST MEDICAL HISTORY: Past Medical History:  Diagnosis Date  . Anxiety   . History of heart attack   . Hypertension   .  Hypothyroidism   . Kidney stone   . Vitamin D deficiency     PAST SURGICAL HISTORY: Past Surgical History:  Procedure Laterality Date  . CESAREAN SECTION    . removal of kidney stones      SOCIAL HISTORY: Social History   Tobacco Use  . Smoking status: Never Smoker  . Smokeless tobacco: Never Used  Substance Use Topics  . Alcohol use: No  . Drug use: No    FAMILY HISTORY: Family History  Problem Relation Age of Onset  . Cancer  Maternal Aunt        lung  . Breast cancer Maternal Aunt   . Cancer Paternal Aunt        breast  . Cancer Paternal Grandmother        breast  . Eating disorder Mother   . Hypertension Father   . Stroke Father   . Heart disease Father     PHYSICAL EXAM: Pt in no acute distress  RECENT LABS AND TESTS: BMET    Component Value Date/Time   NA 140 07/14/2018 1145   K 4.4 07/14/2018 1145   CL 103 07/14/2018 1145   CO2 23 07/14/2018 1145   GLUCOSE 94 07/14/2018 1145   GLUCOSE 86 12/27/2013 0845   BUN 11 07/14/2018 1145   CREATININE 0.62 07/14/2018 1145   CALCIUM 9.3 07/14/2018 1145   GFRNONAA 112 07/14/2018 1145   GFRAA 129 07/14/2018 1145   Lab Results  Component Value Date   HGBA1C 5.7 (H) 07/14/2018   HGBA1C 5.7 12/27/2013   Lab Results  Component Value Date   INSULIN 27.3 (H) 07/14/2018   CBC    Component Value Date/Time   WBC 9.3 07/14/2018 1145   WBC 12.8 (H) 12/27/2013 0845   RBC 4.51 07/14/2018 1145   RBC 4.52 12/27/2013 0845   HGB 11.0 (L) 07/14/2018 1145   HCT 34.5 07/14/2018 1145   PLT 289.0 12/27/2013 0845   MCV 77 (L) 07/14/2018 1145   MCH 24.4 (L) 07/14/2018 1145   MCHC 31.9 07/14/2018 1145   MCHC 33.5 12/27/2013 0845   RDW 14.5 07/14/2018 1145   LYMPHSABS 2.0 07/14/2018 1145   MONOABS 0.7 12/27/2013 0845   EOSABS 0.1 07/14/2018 1145   BASOSABS 0.1 07/14/2018 1145   Iron/TIBC/Ferritin/ %Sat No results found for: IRON, TIBC, FERRITIN, IRONPCTSAT Lipid Panel     Component Value Date/Time   CHOL 191 07/14/2018 1145   TRIG 96 07/14/2018 1145   HDL 51 07/14/2018 1145   CHOLHDL 3 12/27/2013 0845   VLDL 13.6 12/27/2013 0845   LDLCALC 121 (H) 07/14/2018 1145   Hepatic Function Panel     Component Value Date/Time   PROT 7.2 07/14/2018 1145   ALBUMIN 4.4 07/14/2018 1145   AST 12 07/14/2018 1145   ALT 13 07/14/2018 1145   ALKPHOS 107 07/14/2018 1145   BILITOT 0.3 07/14/2018 1145   BILIDIR 0.1 12/27/2013 0845      Component Value  Date/Time   TSH 2.310 07/14/2018 1145   TSH 1.57 12/27/2013 0845   TSH 2.36 12/08/2012 1542   Results for KIEARRA, PIRIE (MRN [id number]) as of 09/29/2018 13:01  Ref. Range 07/14/2018 11:45  Vitamin D, 25-Hydroxy Latest Ref Range: 30.0 - 100.0 ng/mL 7.6 (L)     I, Kirke Corin, CMA, am acting as transcriptionist for Wilder Glade, MD I have reviewed the above documentation for accuracy and completeness, and I agree with the above. -Quillian Quince, MD

## 2018-10-04 ENCOUNTER — Telehealth (INDEPENDENT_AMBULATORY_CARE_PROVIDER_SITE_OTHER): Payer: Self-pay | Admitting: Psychology

## 2018-10-04 NOTE — Telephone Encounter (Signed)
  Office: [phone]  /  Fax: [phone]  Date of Call: October 04, 2018 Time of Call: 2:13pm Duration of Call: ~ 1 minute Provider: Lawerance Cruel, PsyD  CONTENT: This provider called [name] to schedule a follow-up appointment. A HIPAA compliant voicemail was left requesting a call back.  PLAN: This provider's office will call Alonnie to follow-up again next week if needed.

## 2018-10-09 ENCOUNTER — Other Ambulatory Visit (INDEPENDENT_AMBULATORY_CARE_PROVIDER_SITE_OTHER): Payer: Self-pay | Admitting: Family Medicine

## 2018-10-09 DIAGNOSIS — E559 Vitamin D deficiency, unspecified: Secondary | ICD-10-CM

## 2018-10-13 ENCOUNTER — Encounter (INDEPENDENT_AMBULATORY_CARE_PROVIDER_SITE_OTHER): Payer: Self-pay | Admitting: Family Medicine

## 2018-10-13 ENCOUNTER — Other Ambulatory Visit: Payer: Self-pay

## 2018-10-13 ENCOUNTER — Ambulatory Visit (INDEPENDENT_AMBULATORY_CARE_PROVIDER_SITE_OTHER): Payer: BC Managed Care – PPO | Admitting: Family Medicine

## 2018-10-13 DIAGNOSIS — Z6841 Body Mass Index (BMI) 40.0 and over, adult: Secondary | ICD-10-CM

## 2018-10-13 DIAGNOSIS — R7303 Prediabetes: Secondary | ICD-10-CM | POA: Diagnosis not present

## 2018-10-13 NOTE — Progress Notes (Signed)
Office: [phone]  /  Fax: [phone] TeleHealth Visit:  Elizabeth Gibson has verbally consented to this TeleHealth visit today. The patient is located at work, the provider is located at the [employer] [facility] office. The participants in this visit include the listed provider and patient. The visit was conducted today via telephone. Elizabeth Gibson was unable to use realtime audiovisual technology today and the telehealth visit was conducted via telephone.  HPI:   Chief Complaint: OBESITY Elizabeth Gibson is here to discuss her progress with her obesity treatment plan. She is on the Category 3 plan and is following her eating plan approximately 99 % of the time. 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 Chosen feels she is doing well with weight loss, but she notes, she is more sedentary at work, so she has started being more active and she has increased activity. Elizabeth Gibson is doing well on her eating plan. She is getting good support at home from her husband. We were unable to weigh the patient today for this TeleHealth visit. She feels as if she has lost weight since her last visit. She has lost 26 lbs since starting treatment with [place].  Pre-Diabetes Elizabeth Gibson has a diagnosis of prediabetes based on her elevated Hgb A1c and was informed this puts her at greater risk of developing diabetes. She is stable with diet and exercise. Elizabeth Gibson continues to work on diet and exercise to decrease risk of diabetes. Elizabeth Gibson has decreased polyphagia and she denies hypoglycemia. Elizabeth Gibson is not on metformin yet, as she is not on birth control pills and she is still sexually active.  ASSESSMENT AND PLAN:  Prediabetes  Class 3 severe obesity with serious comorbidity and body mass index (BMI) of 45.0 to 49.9 in adult, unspecified obesity type [facility])  PLAN:  Pre-Diabetes Elizabeth Gibson will continue to work on weight loss, exercise, and decreasing simple carbohydrates in her diet to  help decrease the risk of diabetes. We dicussed metformin including benefits and risks. She was informed that eating too many simple carbohydrates or too many calories at one sitting increases the likelihood of GI side effects. We will hold metformin prescription fill until we start birth control and we will follow. Elizabeth Gibson agreed to follow up with [place] as directed to monitor her progress.  I spent > than 50% of the 15 minute visit on counseling as documented in the note.  Obesity Elizabeth Gibson is currently in the action stage of change. As such, her goal is to continue with weight loss efforts She has agreed to follow the Category 3 plan Elizabeth Gibson has been instructed to work up to a goal of 150 minutes of combined cardio and strengthening exercise per week for weight loss and overall health benefits. We discussed the following Behavioral Modification Strategies today: no skipping meals, emotional eating strategies, ways to avoid boredom eating and ways to avoid night time snacking  Elizabeth Gibson has agreed to follow up with our clinic in 3 weeks. She was informed of the importance of frequent follow up visits to maximize her success with intensive lifestyle modifications for her multiple health conditions.  ALLERGIES: No Known Allergies  MEDICATIONS: Current Outpatient Medications on File Prior to Visit  Medication Sig Dispense Refill  . levothyroxine (SYNTHROID, LEVOTHROID) 50 MCG tablet Take 50 mcg by mouth daily before breakfast.    . losartan-hydrochlorothiazide (HYZAAR) 50-12.5 MG tablet Take 1 tablet by mouth daily.    . metFORMIN (GLUCOPHAGE)  500 MG tablet Take 1 tablet (500 mg total) by mouth daily with breakfast. 30 tablet 0  . Vitamin D, Ergocalciferol, (DRISDOL) 1.25 MG (50000 UT) CAPS capsule Take 1 capsule (50,000 Units total) by mouth every 7 (seven) days. 4 capsule 0   No current facility-administered medications on file prior to visit.     PAST MEDICAL HISTORY: Past Medical History:  Diagnosis  Date  . Anxiety   . History of heart attack   . Hypertension   . Hypothyroidism   . Kidney stone   . Vitamin D deficiency     PAST SURGICAL HISTORY: Past Surgical History:  Procedure Laterality Date  . CESAREAN SECTION    . removal of kidney stones      SOCIAL HISTORY: Social History   Tobacco Use  . Smoking status: Never Smoker  . Smokeless tobacco: Never Used  Substance Use Topics  . Alcohol use: No  . Drug use: No    FAMILY HISTORY: Family History  Problem Relation Age of Onset  . Cancer Maternal Aunt        lung  . Breast cancer Maternal Aunt   . Cancer Paternal Aunt        breast  . Cancer Paternal Grandmother        breast  . Eating disorder Mother   . Hypertension Father   . Stroke Father   . Heart disease Father     ROS: Review of Systems  Constitutional: Positive for weight loss.  Endo/Heme/Allergies:       Positive for polyphagia Negative for hypoglycemia    PHYSICAL EXAM: Pt in no acute distress  RECENT LABS AND TESTS: BMET    Component Value Date/Time   NA 140 07/14/2018 1145   K 4.4 07/14/2018 1145   CL 103 07/14/2018 1145   CO2 23 07/14/2018 1145   GLUCOSE 94 07/14/2018 1145   GLUCOSE 86 12/27/2013 0845   BUN 11 07/14/2018 1145   CREATININE 0.62 07/14/2018 1145   CALCIUM 9.3 07/14/2018 1145   GFRNONAA 112 07/14/2018 1145   GFRAA 129 07/14/2018 1145   Lab Results  Component Value Date   HGBA1C 5.7 (H) 07/14/2018   HGBA1C 5.7 12/27/2013   Lab Results  Component Value Date   INSULIN 27.3 (H) 07/14/2018   CBC    Component Value Date/Time   WBC 9.3 07/14/2018 1145   WBC 12.8 (H) 12/27/2013 0845   RBC 4.51 07/14/2018 1145   RBC 4.52 12/27/2013 0845   HGB 11.0 (L) 07/14/2018 1145   HCT 34.5 07/14/2018 1145   PLT 289.0 12/27/2013 0845   MCV 77 (L) 07/14/2018 1145   MCH 24.4 (L) 07/14/2018 1145   MCHC 31.9 07/14/2018 1145   MCHC 33.5 12/27/2013 0845   RDW 14.5 07/14/2018 1145   LYMPHSABS 2.0 07/14/2018 1145   MONOABS  0.7 12/27/2013 0845   EOSABS 0.1 07/14/2018 1145   BASOSABS 0.1 07/14/2018 1145   Iron/TIBC/Ferritin/ %Sat No results found for: IRON, TIBC, FERRITIN, IRONPCTSAT Lipid Panel     Component Value Date/Time   CHOL 191 07/14/2018 1145   TRIG 96 07/14/2018 1145   HDL 51 07/14/2018 1145   CHOLHDL 3 12/27/2013 0845   VLDL 13.6 12/27/2013 0845   LDLCALC 121 (H) 07/14/2018 1145   Hepatic Function Panel     Component Value Date/Time   PROT 7.2 07/14/2018 1145   ALBUMIN 4.4 07/14/2018 1145   AST 12 07/14/2018 1145   ALT 13 07/14/2018 1145   ALKPHOS 107  07/14/2018 1145   BILITOT 0.3 07/14/2018 1145   BILIDIR 0.1 12/27/2013 0845      Component Value Date/Time   TSH 2.310 07/14/2018 1145   TSH 1.57 12/27/2013 0845   TSH 2.36 12/08/2012 1542    Results for TIAJUANA, LEPPANEN (MRN [id number]) as of 10/13/2018 13:43  Ref. Range 07/14/2018 11:45  Vitamin D, 25-Hydroxy Latest Ref Range: 30.0 - 100.0 ng/mL 7.6 (L)    I, Nevada Crane, am acting as transcriptionist for Quillian Quince, MD I have reviewed the above documentation for accuracy and completeness, and I agree with the above. -Quillian Quince, MD

## 2018-10-19 NOTE — Progress Notes (Addendum)
Office: [phone]  /  Fax: [phone]    Date: October 20, 2018   Appointment Start Time: 11:40am Duration: 19 minutes Provider: Lawerance Cruel, Psy.D. 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 female presenting via [employer] WebEx for a follow-up appointment to address the previously established treatment goal of decreasing emotional eating. Today's appointment was a telepsychological visit, as this provider's clinic is closed for in-person visits due to COVID-19. Therapeutic services will resume to in-person appointments once the clinic re-opens. [name] expressed understanding regarding the rationale for telepsychological services, and provided verbal consent for today's appointment. Prior to proceeding with today's appointment, [name]'s physical location at the time of this appointment was obtained. [name] reported she was at work in her office, and provided the address. In the event of technical difficulties, [name] shared a phone number she could be reached at. [name] and this provider participated in today's telepsychological service. Also, [name] denied anyone else being present in the room or on the WebEx appointment.   Of note, this provider was contacted by front desk staff at 11:34am and informed [name] was experiencing issues with e-mail. As such, this provider called [name] at 11:35am, and she indicated she could not find the e-mail sent by this provider with the secure link for today's appointment. The e-mail was resent, and the appointment was initiated 10 minutes late. Notably, [name] indicated she had a meeting at 12pm, and expressed understanding that should she wish to proceed with today's appointment, it would not be for the scheduled 30 minutes.   This provider conducted a brief check-in. [name] shared, "It's been nice to not have to be  running around." She reported she has been working and spending time with her family, including walking and gardening. Regarding eating, [name] noted, "I've actually done really well. I haven't really steered away at all." Thus, the appointment focused on what recent changes can be generalized to when schedules resume to normal. [name] discussed a plan to make herself a "priority," including making dinner nightly by planning ahead. Moreover, she recalled experiencing thoughts regarding approaching finances from the perspective of how her parents handled finances. She noted, "I have to remind myself that I can buy what I need." Thus, today's appointment focused on thought defusion. Psychoeducation regarding thought defusion, including its impact on emotional eating was provided. [name] was led through a thought defusion exercise, and a handout via a MyChart message was provided with various exercises. [name] was encouraged to engage in the thought defusion exercises between now and the next appointment with this provider. [name] agreed. For the exercise, she used the thought "I am fat." Following the exercise, [name] stated, "It was like my throat got stiff and my arms got stiff in the beginning, but then relieved after a little bit."  Prior to sending the MyChart message, this provider explained the message would be visible to all providers, as it would be part of the electronic medical record. [name] verbally acknowledged understanding, and verbally consented to this provider sending the MyChart message. Of note, she also shared a plan to return the completed consent form for telepsychological services via MyChart. Session concluded with a discussion regarding termination. Due to the pandemic, this provider will meet with [name] for additional appointments then what was discussed at the onset of treatment. [name] was receptive and agreeable to double checking her insurance  benefits. [name] was receptive to today's session as  evidenced by openness to sharing, responsiveness to feedback, and engagement in the thought defusion exercise.  Mental Status Examination:  Appearance: neat Behavior: cooperative Mood: euthymic Affect: mood congruent Speech: normal in rate, volume, and tone Eye Contact: appropriate Psychomotor Activity: appropriate Thought Process: linear, logical, and goal directed  Content/Perceptual Disturbances: denies suicidal and homicidal ideation, plan, and intent and no hallucinations, delusions, bizarre thinking or behavior reported or observed Orientation: time, person, place and purpose of appointment Cognition/Sensorium: memory, attention, language, and fund of knowledge intact  Insight: good Judgment: good  Interventions:  Empathic reflections and validation Psychoeducation regarding thought defusion Thought defusion exercise Termination planning Positive reinforcement Brief chart review Employed supportive psychotherapy interventions today to facilitate reduced distress, and to improve coping skills with identified stressors  DSM-5 Diagnosis: 311 (F32.8) Other Specified Depressive Disorder, Emotional Eating Behaviors  Treatment Goal & Progress: During the initial appointment with this provider, the following treatment goal was established: decrease emotional eating. [name] has demonstrated progress in her goal as evidenced by increased awareness of hunger patterns and triggers for emotional eating. She continues to demonstrate willingness to engage in learned skills and she shared she continues to follow the structured meal plan.   Plan: [name] continues to appear able and willing to participate as evidenced by engagement in reciprocal conversation, and asking questions for clarification as appropriate. The next appointment will be scheduled in three weeks, which will be via [employer] Web[employer]. Once this provider's office resumes in-person appointments, [name] will be notified. The next session  will focus further on thought defusion.

## 2018-10-20 ENCOUNTER — Ambulatory Visit (INDEPENDENT_AMBULATORY_CARE_PROVIDER_SITE_OTHER): Payer: BC Managed Care – PPO | Admitting: Psychology

## 2018-10-20 ENCOUNTER — Encounter (INDEPENDENT_AMBULATORY_CARE_PROVIDER_SITE_OTHER): Payer: Self-pay

## 2018-10-20 ENCOUNTER — Other Ambulatory Visit: Payer: Self-pay

## 2018-10-20 DIAGNOSIS — F3289 Other specified depressive episodes: Secondary | ICD-10-CM | POA: Diagnosis not present

## 2018-10-22 ENCOUNTER — Other Ambulatory Visit (INDEPENDENT_AMBULATORY_CARE_PROVIDER_SITE_OTHER): Payer: Self-pay | Admitting: Family Medicine

## 2018-10-22 DIAGNOSIS — E559 Vitamin D deficiency, unspecified: Secondary | ICD-10-CM

## 2018-11-03 ENCOUNTER — Encounter (INDEPENDENT_AMBULATORY_CARE_PROVIDER_SITE_OTHER): Payer: Self-pay | Admitting: Family Medicine

## 2018-11-03 ENCOUNTER — Ambulatory Visit (INDEPENDENT_AMBULATORY_CARE_PROVIDER_SITE_OTHER): Payer: BC Managed Care – PPO | Admitting: Family Medicine

## 2018-11-03 ENCOUNTER — Other Ambulatory Visit: Payer: Self-pay

## 2018-11-03 DIAGNOSIS — E559 Vitamin D deficiency, unspecified: Secondary | ICD-10-CM

## 2018-11-03 DIAGNOSIS — Z6841 Body Mass Index (BMI) 40.0 and over, adult: Secondary | ICD-10-CM

## 2018-11-03 NOTE — Progress Notes (Signed)
Office: [phone]  /  Fax: [phone] TeleHealth Visit:  Elizabeth Gibson has verbally consented to this TeleHealth visit today. The patient is located at work, the provider is located at the [employer] [facility] office. The participants in this visit include the listed provider and patient. The visit was conducted today via Face Time.  HPI:   Chief Complaint: OBESITY Elizabeth Gibson is here to discuss her progress with her obesity treatment plan. She is on the Category 3 plan and is following her eating plan approximately 98 % of the time. She states she is walking 40 minutes 4 to 5 times per week. Elizabeth Gibson feels that she is doing well with weight loss and suspects that she has lost another 4 pounds. Her hunger is controlled and she is doing home improvement projects most days of the week, like painting and pressure washing her house, in addition to walking most days.  We were unable to weigh the patient today for this TeleHealth visit. She feels as if she has lost weight since her last visit. She has lost 20 lbs since starting treatment with [place].  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 She is currently stable on vit D, but is not yet at goal. Elizabeth Gibson denies nausea, vomiting, or muscle weakness.  ASSESSMENT AND PLAN:  Vitamin D deficiency - Plan: Vitamin D, Ergocalciferol, (DRISDOL) 1.25 MG (50000 UT) CAPS capsule  Class 3 severe obesity with serious comorbidity and body mass index (BMI) of 45.0 to 49.9 in adult, unspecified obesity type (HCC)  PLAN:  Vitamin D Deficiency Elizabeth Gibson was informed that low vitamin D levels contribute to fatigue and are associated with obesity, breast, and colon cancer. Elizabeth Gibson agrees to continue to take prescription Vit D @50 ,000 IU every week #4 with no refills and will follow up for routine testing of vitamin D, at least 2-3 times per year. She was informed of the risk of over-replacement of vitamin D and agrees to not increase her dose unless  she discusses this with [place] first. Elizabeth Gibson agrees to follow up in 3 weeks as directed.  Obesity Elizabeth Gibson is currently in the action stage of change. As such, her goal is to continue with weight loss efforts. She has agreed to follow the Category 3 plan. Elizabeth Gibson has been instructed to work up to a goal of 150 minutes of combined cardio and strengthening exercise per week for weight loss and overall health benefits. We discussed the following Behavioral Modification Strategies today: increasing lean protein intake, better snacking choices, and ways to avoid night time snacking.  Elizabeth Gibson has agreed to follow up with our clinic in 3 weeks. She was informed of the importance of frequent follow up visits to maximize her success with intensive lifestyle modifications for her multiple health conditions.  ALLERGIES: No Known Allergies  MEDICATIONS: Current Outpatient Medications on File Prior to Visit  Medication Sig Dispense Refill  . levothyroxine (SYNTHROID, LEVOTHROID) 50 MCG tablet Take 50 mcg by mouth daily before breakfast.    . losartan-hydrochlorothiazide (HYZAAR) 50-12.5 MG tablet Take 1 tablet by mouth daily.    . metFORMIN (GLUCOPHAGE) 500 MG tablet Take 1 tablet (500 mg total) by mouth daily with breakfast. 30 tablet 0  . Vitamin D, Ergocalciferol, (DRISDOL) 1.25 MG (50000 UT) CAPS capsule Take 1 capsule (50,000 Units total) by mouth every 7 (seven) days. 4 capsule 0   No current facility-administered medications on file prior to visit.     PAST MEDICAL HISTORY: Past Medical  History:  Diagnosis Date  . Anxiety   . History of heart attack   . Hypertension   . Hypothyroidism   . Kidney stone   . Vitamin D deficiency     PAST SURGICAL HISTORY: Past Surgical History:  Procedure Laterality Date  . CESAREAN SECTION    . removal of kidney stones      SOCIAL HISTORY: Social History   Tobacco Use  . Smoking status: Never Smoker  . Smokeless tobacco: Never Used  Substance Use Topics   . Alcohol use: No  . Drug use: No    FAMILY HISTORY: Family History  Problem Relation Age of Onset  . Cancer Maternal Aunt        lung  . Breast cancer Maternal Aunt   . Cancer Paternal Aunt        breast  . Cancer Paternal Grandmother        breast  . Eating disorder Mother   . Hypertension Father   . Stroke Father   . Heart disease Father     ROS: Review of Systems  Gastrointestinal: Negative for nausea and vomiting.  Musculoskeletal:       Negative for muscle weakness.    PHYSICAL EXAM: Pt in no acute distress  RECENT LABS AND TESTS: BMET    Component Value Date/Time   NA 140 07/14/2018 1145   K 4.4 07/14/2018 1145   CL 103 07/14/2018 1145   CO2 23 07/14/2018 1145   GLUCOSE 94 07/14/2018 1145   GLUCOSE 86 12/27/2013 0845   BUN 11 07/14/2018 1145   CREATININE 0.62 07/14/2018 1145   CALCIUM 9.3 07/14/2018 1145   GFRNONAA 112 07/14/2018 1145   GFRAA 129 07/14/2018 1145   Lab Results  Component Value Date   HGBA1C 5.7 (H) 07/14/2018   HGBA1C 5.7 12/27/2013   Lab Results  Component Value Date   INSULIN 27.3 (H) 07/14/2018   CBC    Component Value Date/Time   WBC 9.3 07/14/2018 1145   WBC 12.8 (H) 12/27/2013 0845   RBC 4.51 07/14/2018 1145   RBC 4.52 12/27/2013 0845   HGB 11.0 (L) 07/14/2018 1145   HCT 34.5 07/14/2018 1145   PLT 289.0 12/27/2013 0845   MCV 77 (L) 07/14/2018 1145   MCH 24.4 (L) 07/14/2018 1145   MCHC 31.9 07/14/2018 1145   MCHC 33.5 12/27/2013 0845   RDW 14.5 07/14/2018 1145   LYMPHSABS 2.0 07/14/2018 1145   MONOABS 0.7 12/27/2013 0845   EOSABS 0.1 07/14/2018 1145   BASOSABS 0.1 07/14/2018 1145   Iron/TIBC/Ferritin/ %Sat No results found for: IRON, TIBC, FERRITIN, IRONPCTSAT Lipid Panel     Component Value Date/Time   CHOL 191 07/14/2018 1145   TRIG 96 07/14/2018 1145   HDL 51 07/14/2018 1145   CHOLHDL 3 12/27/2013 0845   VLDL 13.6 12/27/2013 0845   LDLCALC 121 (H) 07/14/2018 1145   Hepatic Function Panel      Component Value Date/Time   PROT 7.2 07/14/2018 1145   ALBUMIN 4.4 07/14/2018 1145   AST 12 07/14/2018 1145   ALT 13 07/14/2018 1145   ALKPHOS 107 07/14/2018 1145   BILITOT 0.3 07/14/2018 1145   BILIDIR 0.1 12/27/2013 0845      Component Value Date/Time   TSH 2.310 07/14/2018 1145   TSH 1.57 12/27/2013 0845   TSH 2.36 12/08/2012 1542   Results for BRAEDYN, RIGGLE (MRN [id number]) as of 11/03/2018 12:47  Ref. Range 07/14/2018 11:45  Vitamin D, 25-Hydroxy Latest Ref Range: 30.0 -  100.0 ng/mL 7.6 (L)    I, Kirke Corinara Soares, CMA, am acting as transcriptionist for Wilder Gladearen D. Ilayda Toda, MD I have reviewed the above documentation for accuracy and completeness, and I agree with the above. -Quillian Quincearen Josefa [place], MD

## 2018-11-09 ENCOUNTER — Ambulatory Visit (INDEPENDENT_AMBULATORY_CARE_PROVIDER_SITE_OTHER): Payer: BC Managed Care – PPO | Admitting: Psychology

## 2018-11-09 ENCOUNTER — Other Ambulatory Visit: Payer: Self-pay

## 2018-11-09 DIAGNOSIS — F3289 Other specified depressive episodes: Secondary | ICD-10-CM

## 2018-11-09 NOTE — Progress Notes (Signed)
Office: [phone]  /  Fax: [phone]    Date: Nov 09, 2018  Appointment Start Time: 9:00am Duration: 30 minutes Provider: Lawerance CruelGaytri Ridhaan Dreibelbis, Psy.D. Type of Session: Individual Therapy  Location of Patient: Home Location of Provider: Provider's Home Type of Contact: Telepsychological Visit via [employer]   Session Content: Elizabeth Gibson is a 43 y.o. female presenting via [employer] for a follow-up appointment to address the previously established treatment goal of decreasing emotional eating. Today's appointment was a telepsychological visit, as this provider's clinic is closed for in-person visits due to COVID-19. Therapeutic services will resume to in-person appointments once the clinic re-opens. Elizabeth Gibson expressed understanding regarding the rationale for telepsychological services, and provided verbal consent for today's appointment. Prior to proceeding with today's appointment, Quetzalli's physical location at the time of this appointment was obtained. Elizabeth Gibson reported she was at home and provided the address. In the event of technical difficulties, Elizabeth Gibson shared a phone number she could be reached at. Elizabeth Gibson and this provider participated in today's telepsychological service. Also, Elizabeth Gibson denied anyone else being present in the room or on the WebEx appointment.  This provider conducted a brief check-in and verbally administered the PHQ-9 and GAD-7. 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 In addition, Elizabeth Gibson shared she is working on painting her home. Elizabeth Gibson further discussed spending time with her family and believes they will continue to engage in self-care and prioritize family time post- COVID-19. Regarding eating, Elizabeth Gibson shared, "I think it is going good." She believes she has not really deviated from the structured meal plan aside from portion sizes occassionally, and she discussed frequently checking-in with herself to  ensure she is also drinking enough water. The aforementioned was positively reinforced. Elizabeth Gibson acknowledged at times thinking, "All the weight should be gone, but it doesn't work like that." The aforementioned was normalized and Elizabeth Gibson shared that in the past she had a history of giving up when she was not losing weight fast enough. Thus, what is different about this time was explored. She reported the support of the clinic and having an emphasis on medical conditions (e.g., labs) has been "very, very helpful." She shared, "I feel way better. I feel so much better. I even feel my mind is clear." She also reported an improvement in medical concerns, feeling in control, and making herself a priority. As such, it was recommended she make note of her progress for future reference when feeling disappointed with progress; Elizabeth Gibson was agreeable. Regarding emotional eating, Elizabeth Gibson stated, "I try to pay attention to it and I think the only eating I'm doing right now is if I'm bored." This was explored further. Elizabeth Gibson stated that while she may eat out of boredom, she utilizes her snack calories. This provider discussed utilization of learned skills, specifically thought defusion to assist with coping. Elizabeth Gibson was agreeable and receptive to this provider sending a handout with an additional thought defusion exercise. Elizabeth Gibson provided verbal consent during today's appointment for this provider to send the handout via e-mail. Furthermore, this provider and Elizabeth Gibson discussed termination of services. Elizabeth Gibson was receptive to scheduling an additional follow-up appointment in 4 weeks, and if she continues to progress, services will be terminated. Overall, Elizabeth Gibson was receptive to today's session as evidenced by openness to sharing, responsiveness to feedback, and willingness to engage in learned skills.  Mental Status Examination:  Appearance: neat Behavior: cooperative Mood: euthymic Affect: mood congruent Speech: normal in rate,  volume,  and tone Eye Contact: appropriate Psychomotor Activity: appropriate Thought Process: linear, logical, and goal directed  Content/Perceptual Disturbances: denies suicidal and homicidal ideation, plan, and intent and no hallucinations, delusions, bizarre thinking or behavior reported or observed Orientation: time, person, place and purpose of appointment Cognition/Sensorium: memory, attention, language, and fund of knowledge intact  Insight: good Judgment: good  Structured Assessment Results: The Patient Health Questionnaire-9 (PHQ-9) is a self-report measure that assesses symptoms and severity of depression over the course of the last two weeks. Ashling obtained a score of 0. Decreased interest 0  Down, depressed, hopeless 0  Altered sleeping 0  Tired, decreased energy 0  Change in appetite 0  Feeling bad or failure about yourself 0  Trouble concentrating 0  Moving slowly or fidgety/restless 0  Suicidal thoughts 0  PHQ-9 Score 0    The Generalized Anxiety Disorder-7 (GAD-7) is a brief self-report measure that assesses symptoms of anxiety over the course of the last two weeks. Shanquetta obtained a score of 0. Nervous, anxious, on edge 0  Control/stop worrying 0  Worrying too much- different things 0  Trouble relaxing 0  Restless 0  Easily annoyed or irritable 0  Afraid-awful might happen 0  GAD-7 Score 0   Interventions:  Verbal administration of PHQ-9 and GAD-7 for symptom monitoring Reviewed content from the previous session Provided empathic reflections and validation Reviewed learned skills Discussed termination planning Provided positive reinforcement Employed supportive psychotherapy interventions to facilitate reduced distress, and to improve coping skills with identified stressors Employed insight oriented and cognitive psychotherapy interventions to identify and modify anxiety/mood producing thoughts, beliefs, and negative self-appraisals contributing to distress Conducted a  brief chart review  DSM-5 Diagnosis: 311 (F32.8) Other Specified Depressive Disorder, Emotional Eating Behaviors  Treatment Goal & Progress: During the initial appointment with this provider, the following treatment goal was established: decrease emotional eating. Aubry has demonstrated progress in her goal as evidenced by increased awareness of hunger patterns and triggers for emotional eating. Since the onset of treatment, there is a reduction in emotional eating. While Laxmi disclosed occasional emotional eating due to boredom, she indicated she utilizes snack calories allocated in her structured meal plan. She also continues to demonstrate willingness to engage in learned skills.   Plan: Timarie continues to appear able and willing to participate as evidenced by engagement in reciprocal conversation, and asking questions for clarification as appropriate. The next appointment will be scheduled in one month, which will be via [employer]. Once this provider's office resumes in-person appointments, Sarajean will be notified. The next session will focus on reviewing learned skills and possible termination.

## 2018-11-11 ENCOUNTER — Other Ambulatory Visit (INDEPENDENT_AMBULATORY_CARE_PROVIDER_SITE_OTHER): Payer: Self-pay | Admitting: Family Medicine

## 2018-11-11 DIAGNOSIS — E559 Vitamin D deficiency, unspecified: Secondary | ICD-10-CM

## 2018-11-14 MED ORDER — VITAMIN D (ERGOCALCIFEROL) 1.25 MG (50000 UNIT) PO CAPS: 50000.0000 [IU] | ORAL_CAPSULE | ORAL | 0 refills | Status: DC

## 2018-11-22 ENCOUNTER — Encounter (INDEPENDENT_AMBULATORY_CARE_PROVIDER_SITE_OTHER): Payer: Self-pay | Admitting: Family Medicine

## 2018-11-22 ENCOUNTER — Other Ambulatory Visit: Payer: Self-pay

## 2018-11-22 ENCOUNTER — Ambulatory Visit (INDEPENDENT_AMBULATORY_CARE_PROVIDER_SITE_OTHER): Payer: BC Managed Care – PPO | Admitting: Family Medicine

## 2018-11-22 DIAGNOSIS — D508 Other iron deficiency anemias: Secondary | ICD-10-CM | POA: Diagnosis not present

## 2018-11-22 DIAGNOSIS — Z6841 Body Mass Index (BMI) 40.0 and over, adult: Secondary | ICD-10-CM | POA: Diagnosis not present

## 2018-11-23 NOTE — Progress Notes (Signed)
Office: [phone]  /  Fax: [phone] TeleHealth Visit:  Elizabeth Gibson has verbally consented to this TeleHealth visit today. The patient is located at work, the provider is located at the [employer] [facility] office. The participants in this visit include the listed provider and patient. The visit was conducted today via Face Time.  HPI:   Chief Complaint: OBESITY Elizabeth Gibson is here to discuss her progress with her obesity treatment plan. She is on the Category 3 plan and is following her eating plan approximately 98 % of the time. She states she is very active while painting her house. Elizabeth Gibson feels that she is doing well on her diet prescription and thinks that she has lost another 2 pounds. She is happy with her meal plan and her hunger is mostly controlled.  We were unable to weigh the patient today for this TeleHealth visit. She feels as if she has lost weight since her last visit. She has lost 20 lbs since starting treatment with [place].  Anemia Elizabeth Gibson has a diagnosis of anemia. She is on an iron rich diet, but was rejected from donating blood due to being anemic. She has a very heavy menses and is trying to get in to see her gynecologist for an IUD to help with this.  ASSESSMENT AND PLAN:  Other iron deficiency anemia  Class 3 severe obesity with serious comorbidity and body mass index (BMI) of 45.0 to 49.9 in adult, unspecified obesity type (HCC)  PLAN:  Anemia The diagnosis of Iron deficiency anemia was discussed with [name] and was explained in detail. We discussed the importance of iron rich foods and how fixing her anemia will help her resting metabolic rate. Elizabeth Gibson agreed to call today to try to get an appointment if her gynecologist has reopened after COVID19.  I spent > than 50% of the 25 minute visit on counseling as documented in the note.  Obesity Elizabeth Gibson is currently in the action stage of change. As such, her goal is to continue with weight loss efforts. She has agreed  to follow the Category 3 plan. We discussed iron rich foods. Elizabeth Gibson has been instructed to work up to a goal of 150 minutes of combined cardio and strengthening exercise per week for weight loss and overall health benefits. We discussed the following Behavioral Modification Strategies today: work on meal planning and easy cooking plans and emotional eating strategies.  Elizabeth Gibson has agreed to follow up with our clinic in 2 weeks. She was informed of the importance of frequent follow up visits to maximize her success with intensive lifestyle modifications for her multiple health conditions.  ALLERGIES: No Known Allergies  MEDICATIONS: Current Outpatient Medications on File Prior to Visit  Medication Sig Dispense Refill  . levothyroxine (SYNTHROID, LEVOTHROID) 50 MCG tablet Take 50 mcg by mouth daily before breakfast.    . losartan-hydrochlorothiazide (HYZAAR) 50-12.5 MG tablet Take 1 tablet by mouth daily.    . metFORMIN (GLUCOPHAGE) 500 MG tablet Take 1 tablet (500 mg total) by mouth daily with breakfast. 30 tablet 0  . Vitamin D, Ergocalciferol, (DRISDOL) 1.25 MG (50000 UT) CAPS capsule Take 1 capsule (50,000 Units total) by mouth every 7 (seven) days. 4 capsule 0   No current facility-administered medications on file prior to visit.     PAST MEDICAL HISTORY: Past Medical History:  Diagnosis Date  . Anxiety   . History of heart attack   . Hypertension   . Hypothyroidism   . Kidney stone   .  Vitamin D deficiency     PAST SURGICAL HISTORY: Past Surgical History:  Procedure Laterality Date  . CESAREAN SECTION    . removal of kidney stones      SOCIAL HISTORY: Social History   Tobacco Use  . Smoking status: Never Smoker  . Smokeless tobacco: Never Used  Substance Use Topics  . Alcohol use: No  . Drug use: No    FAMILY HISTORY: Family History  Problem Relation Age of Onset  . Cancer Maternal Aunt        lung  . Breast cancer Maternal Aunt   . Cancer Paternal Aunt         breast  . Cancer Paternal Grandmother        breast  . Eating disorder Mother   . Hypertension Father   . Stroke Father   . Heart disease Father     ROS: ROS  PHYSICAL EXAM: Pt in no acute distress  RECENT LABS AND TESTS: BMET    Component Value Date/Time   NA 140 07/14/2018 1145   K 4.4 07/14/2018 1145   CL 103 07/14/2018 1145   CO2 23 07/14/2018 1145   GLUCOSE 94 07/14/2018 1145   GLUCOSE 86 12/27/2013 0845   BUN 11 07/14/2018 1145   CREATININE 0.62 07/14/2018 1145   CALCIUM 9.3 07/14/2018 1145   GFRNONAA 112 07/14/2018 1145   GFRAA 129 07/14/2018 1145   Lab Results  Component Value Date   HGBA1C 5.7 (H) 07/14/2018   HGBA1C 5.7 12/27/2013   Lab Results  Component Value Date   INSULIN 27.3 (H) 07/14/2018   CBC    Component Value Date/Time   WBC 9.3 07/14/2018 1145   WBC 12.8 (H) 12/27/2013 0845   RBC 4.51 07/14/2018 1145   RBC 4.52 12/27/2013 0845   HGB 11.0 (L) 07/14/2018 1145   HCT 34.5 07/14/2018 1145   PLT 289.0 12/27/2013 0845   MCV 77 (L) 07/14/2018 1145   MCH 24.4 (L) 07/14/2018 1145   MCHC 31.9 07/14/2018 1145   MCHC 33.5 12/27/2013 0845   RDW 14.5 07/14/2018 1145   LYMPHSABS 2.0 07/14/2018 1145   MONOABS 0.7 12/27/2013 0845   EOSABS 0.1 07/14/2018 1145   BASOSABS 0.1 07/14/2018 1145   Iron/TIBC/Ferritin/ %Sat No results found for: IRON, TIBC, FERRITIN, IRONPCTSAT Lipid Panel     Component Value Date/Time   CHOL 191 07/14/2018 1145   TRIG 96 07/14/2018 1145   HDL 51 07/14/2018 1145   CHOLHDL 3 12/27/2013 0845   VLDL 13.6 12/27/2013 0845   LDLCALC 121 (H) 07/14/2018 1145   Hepatic Function Panel     Component Value Date/Time   PROT 7.2 07/14/2018 1145   ALBUMIN 4.4 07/14/2018 1145   AST 12 07/14/2018 1145   ALT 13 07/14/2018 1145   ALKPHOS 107 07/14/2018 1145   BILITOT 0.3 07/14/2018 1145   BILIDIR 0.1 12/27/2013 0845      Component Value Date/Time   TSH 2.310 07/14/2018 1145   TSH 1.57 12/27/2013 0845   TSH 2.36  12/08/2012 1542   Results for Elizabeth Gibson, Elizabeth Gibson (MRN [id number]) as of 11/23/2018 13:08  Ref. Range 07/14/2018 11:45  Vitamin D, 25-Hydroxy Latest Ref Range: 30.0 - 100.0 ng/mL 7.6 (L)     I, Kirke Corinara Toney Difatta, CMA, am acting as transcriptionist for Wilder Gladearen D. Beasley, MD I have reviewed the above documentation for accuracy and completeness, and I agree with the above. -Quillian Quincearen Beasley, MD

## 2018-12-05 ENCOUNTER — Ambulatory Visit (INDEPENDENT_AMBULATORY_CARE_PROVIDER_SITE_OTHER): Payer: BC Managed Care – PPO | Admitting: Family Medicine

## 2018-12-06 NOTE — Progress Notes (Signed)
Office: [phone]  /  Fax: [phone]    Date: December 07, 2018   Appointment Start Time: 2:36pm Duration: 24 minutes Provider: Glennie Isle, Psy.D. Type of Session: Individual Therapy  Location of Patient: Work Location of Provider: Provider's Home Type of Contact: Telepsychological Visit via [employer]   Session Content: Elizabeth Gibson is a 43 y.o. female presenting via [place] for a follow-up appointment to address the previously established treatment goal of decreasing emotional eating. Of note, this provider called Elizabeth Gibson at 2:34PM and she indicated she forgot the appointment, but was able to join. As such, today's appointment was initiated 6 minutes late. Today's appointment was a telepsychological visit, as this provider's clinic is seeing a limited number of patients for in-person visits due to COVID-19. Therapeutic services will resume to in-person appointments once deemed appropriate. [name] expressed understanding regarding the rationale for telepsychological services, and provided verbal consent for today's appointment. Prior to proceeding with today's appointment, Elizabeth Gibson's physical location at the time of this appointment was obtained. Elizabeth Gibson reported she was at work and provided the address. In the event of technical difficulties, Elizabeth Gibson shared a phone number she could be reached at. Elizabeth Gibson and this provider participated in today's telepsychological service. Also, Elizabeth Gibson denied anyone else being present in the room or on the WebEx appointment.  This provider conducted a brief check-in and verbally administered the PHQ-9 and GAD-7. Notably, this provider requested Elizabeth Gibson complete and return the telepsychological consent previously sent to her. 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 She discussed difficulty with food when her [place] went a [place] for [place] trip. More specifically, Elizabeth Gibson  indicated eating chips. This was explored further and she discussed experiencing physical hunger and utilizing snack calories for the chips. Since the last appointment with this provider, she denied any episodes of emotional eating. Remainder of today's session focused on Elizabeth Gibson's worry about her ability to stay "consistent" with her eating and exercising habits. The aforementioned was normalized and validated. In addition, this provider explored evidence for her ability to stay consistent, which included the increasing support in her household and the changes she has made outside of the eating arena (e.g.,  Increase in self-care). She also identified the opportunity to come into the clinic has assisted with accountability and noted a plan for an in office visit on June 22nd to be weighed. Remainder of today's session focused on values. Psychoeducation regarding values, including its impact on her overall wellbeing and emotional eating was provided. During today's appointment, Elizabeth Gibson was able to identify that being healthy and her family are values. She was encouraged to establish short-term and long-term goals based on her values and utilize her values when experiencing all or nothing thinking related to eating habits and exercising. As previously planned and discussed, today was the last appointment with this provider. Elizabeth Gibson reported, "I feel like it's great [ referring to treatment with this provider] because it is such great support, but I feel really good and confident about the program." Overall, Elizabeth Gibson was receptive to today's session as evidenced by openness to sharing, responsiveness to feedback, and willingness to continue engaging in learned skills and exploring her values..  Mental Status Examination:  Appearance: neat Behavior: cooperative Mood: euthymic Affect: mood congruent Speech: normal in rate, volume, and tone Eye Contact: appropriate Psychomotor Activity: appropriate Thought Process:  linear, logical, and goal directed  Content/Perceptual Disturbances:  denies suicidal and homicidal ideation, plan, and intent and no hallucinations, delusions, bizarre thinking or behavior reported or observed Orientation: time, person, place and purpose of appointment Cognition/Sensorium: memory, attention, language, and fund of knowledge intact  Insight: good Judgment: good  Structured Assessment Results: The Patient Health Questionnaire-9 (PHQ-9) is a self-report measure that assesses symptoms and severity of depression over the course of the last two weeks. Elizabeth Gibson obtained a score of 0.  Decreased interest 0  Down, depressed, hopeless 0  Altered sleeping 0  Tired, decreased energy 0  Change in appetite 0  Feeling bad or failure about yourself 0  Trouble concentrating 0  Moving slowly or fidgety/restless 0  Suicidal thoughts 0  PHQ-9 Score 0    The Generalized Anxiety Disorder-7 (GAD-7) is a brief self-report measure that assesses symptoms of anxiety over the course of the last two weeks. Elizabeth Gibson obtained a score of 0. Nervous, anxious, on edge 0  Control/stop worrying 0  Worrying too much- different things 0  Trouble relaxing 0  Restless 0  Easily annoyed or irritable 0  Afraid-awful might happen 0  GAD-7 Score 0   Interventions:  Conducted a brief chart review Verbal administration of PHQ-9 and GAD-7 for symptom monitoring Provided empathic reflections and validation Psychoeducation provided regarding values Provided positive reinforcement Employed supportive psychotherapy interventions to facilitate reduced distress, and to improve coping skills with identified stressors Employed acceptance and commitment interventions to emphasize mindfulness and acceptance without struggle Reviewed all or nothing thinking  DSM-5 Diagnosis: 311 (F32.8) Other Specified Depressive Disorder, Emotional Eating Behaviors  Treatment Goal & Progress: During the initial appointment with this  provider, the following treatment goal was established: decrease emotional eating. Elizabeth Gibson has demonstrated progress in her goal as evidenced by increased awareness of hunger patterns and triggers for emotional eating. Since onset of treatment, Elizabeth Gibson indicated a reduction in emotional eating and noted since the last appointment with this provider, there have been no episodes of emotional eating. She also continues to demonstrate willingness to engage in learned skills and there has also been an overall reduction in her PHQ-9 and GAD-7 score since the onset of treatment.  Plan: Today was Rian's last appointment with this provider.

## 2018-12-07 ENCOUNTER — Ambulatory Visit (INDEPENDENT_AMBULATORY_CARE_PROVIDER_SITE_OTHER): Payer: BC Managed Care – PPO | Admitting: Psychology

## 2018-12-07 ENCOUNTER — Encounter (INDEPENDENT_AMBULATORY_CARE_PROVIDER_SITE_OTHER): Payer: Self-pay | Admitting: Family Medicine

## 2018-12-07 ENCOUNTER — Other Ambulatory Visit: Payer: Self-pay

## 2018-12-07 DIAGNOSIS — F3289 Other specified depressive episodes: Secondary | ICD-10-CM | POA: Diagnosis not present

## 2018-12-12 ENCOUNTER — Other Ambulatory Visit (INDEPENDENT_AMBULATORY_CARE_PROVIDER_SITE_OTHER): Payer: Self-pay | Admitting: Family Medicine

## 2018-12-12 DIAGNOSIS — E559 Vitamin D deficiency, unspecified: Secondary | ICD-10-CM

## 2018-12-19 ENCOUNTER — Encounter (INDEPENDENT_AMBULATORY_CARE_PROVIDER_SITE_OTHER): Payer: Self-pay | Admitting: Family Medicine

## 2018-12-19 ENCOUNTER — Ambulatory Visit (INDEPENDENT_AMBULATORY_CARE_PROVIDER_SITE_OTHER): Payer: BC Managed Care – PPO | Admitting: Family Medicine

## 2018-12-19 ENCOUNTER — Other Ambulatory Visit: Payer: Self-pay

## 2018-12-19 VITALS — BP 110/73 | HR 69 | Temp 98.3°F | Ht 66.0 in | Wt 289.0 lb

## 2018-12-19 DIAGNOSIS — Z6841 Body Mass Index (BMI) 40.0 and over, adult: Secondary | ICD-10-CM

## 2018-12-19 DIAGNOSIS — E7849 Other hyperlipidemia: Secondary | ICD-10-CM

## 2018-12-19 DIAGNOSIS — E038 Other specified hypothyroidism: Secondary | ICD-10-CM | POA: Diagnosis not present

## 2018-12-19 DIAGNOSIS — R7303 Prediabetes: Secondary | ICD-10-CM

## 2018-12-19 DIAGNOSIS — Z9189 Other specified personal risk factors, not elsewhere classified: Secondary | ICD-10-CM

## 2018-12-19 DIAGNOSIS — E559 Vitamin D deficiency, unspecified: Secondary | ICD-10-CM

## 2018-12-19 NOTE — Telephone Encounter (Signed)
Please review

## 2018-12-20 LAB — LIPID PANEL WITH LDL/HDL RATIO
Cholesterol, Total: 161 mg/dL (ref 100–199)
HDL: 44 mg/dL (ref >39)
LDL Calculated: 104 mg/dL — ABNORMAL HIGH (ref 0–99)
LDl/HDL Ratio: 2.4 ratio (ref 0.0–3.2)
Triglycerides: 65 mg/dL (ref 0–149)
VLDL Cholesterol Cal: 13 mg/dL (ref 5–40)

## 2018-12-20 LAB — COMPREHENSIVE METABOLIC PANEL
ALT: 16 IU/L (ref 0–32)
AST: 11 IU/L (ref 0–40)
Albumin/Globulin Ratio: 1.5 (ref 1.2–2.2)
Albumin: 4.1 g/dL (ref 3.8–4.8)
Alkaline Phosphatase: 88 IU/L (ref 39–117)
BUN/Creatinine Ratio: 31 — ABNORMAL HIGH (ref 9–23)
BUN: 17 mg/dL (ref 6–24)
Bilirubin Total: <0.2 mg/dL (ref 0.0–1.2)
CO2: 21 mmol/L (ref 20–29)
Calcium: 8.9 mg/dL (ref 8.7–10.2)
Chloride: 103 mmol/L (ref 96–106)
Creatinine, Ser: 0.55 mg/dL — ABNORMAL LOW (ref 0.57–1.00)
GFR calc Af Amer: 134 mL/min/{1.73_m2} (ref >59)
GFR calc non Af Amer: 116 mL/min/{1.73_m2} (ref >59)
Globulin, Total: 2.7 g/dL (ref 1.5–4.5)
Glucose: 105 mg/dL — ABNORMAL HIGH (ref 65–99)
Potassium: 4.8 mmol/L (ref 3.5–5.2)
Sodium: 138 mmol/L (ref 134–144)
Total Protein: 6.8 g/dL (ref 6.0–8.5)

## 2018-12-20 LAB — CBC WITH DIFFERENTIAL
Basophils Absolute: 0.1 10*3/uL (ref 0.0–0.2)
Basos: 1 %
EOS (ABSOLUTE): 0.2 10*3/uL (ref 0.0–0.4)
Eos: 2 %
Hematocrit: 35.5 % (ref 34.0–46.6)
Hemoglobin: 10.7 g/dL — ABNORMAL LOW (ref 11.1–15.9)
Immature Grans (Abs): 0 10*3/uL (ref 0.0–0.1)
Immature Granulocytes: 0 %
Lymphocytes Absolute: 1.6 10*3/uL (ref 0.7–3.1)
Lymphs: 21 %
MCH: 23.9 pg — ABNORMAL LOW (ref 26.6–33.0)
MCHC: 30.1 g/dL — ABNORMAL LOW (ref 31.5–35.7)
MCV: 79 fL (ref 79–97)
Monocytes Absolute: 0.5 10*3/uL (ref 0.1–0.9)
Monocytes: 7 %
Neutrophils Absolute: 5.3 10*3/uL (ref 1.4–7.0)
Neutrophils: 69 %
RBC: 4.48 x10E6/uL (ref 3.77–5.28)
RDW: 16.1 % — ABNORMAL HIGH (ref 11.7–15.4)
WBC: 7.6 10*3/uL (ref 3.4–10.8)

## 2018-12-20 LAB — HEMOGLOBIN A1C
Est. average glucose Bld gHb Est-mCnc: 114 mg/dL
Hgb A1c MFr Bld: 5.6 % (ref 4.8–5.6)

## 2018-12-20 LAB — VITAMIN D 25 HYDROXY (VIT D DEFICIENCY, FRACTURES): Vit D, 25-Hydroxy: 38.9 ng/mL (ref 30.0–100.0)

## 2018-12-20 LAB — TSH: TSH: 2.98 u[IU]/mL (ref 0.450–4.500)

## 2018-12-20 LAB — INSULIN, RANDOM: INSULIN: 29.1 u[IU]/mL — ABNORMAL HIGH (ref 2.6–24.9)

## 2018-12-20 LAB — T4, FREE: Free T4: 1.22 ng/dL (ref 0.82–1.77)

## 2018-12-20 LAB — T3: T3, Total: 98 ng/dL (ref 71–180)

## 2018-12-20 LAB — VITAMIN B12: Vitamin B-12: 555 pg/mL (ref 232–1245)

## 2018-12-20 LAB — FOLATE: Folate: 7.2 ng/mL (ref >3.0)

## 2018-12-20 NOTE — Progress Notes (Signed)
Office: [phone]  /  Fax: [phone]   HPI:   Chief Complaint: OBESITY Elizabeth Gibson is here to discuss her progress with her obesity treatment plan. She is on the Category 3 plan and is following her eating plan approximately 98 % of the time. She states she is swimming 30 minutes 3 times per week and walking 50 minutes 4 to 5 times per week. Elizabeth Gibson has done very well with weight loss and he has lost 118 pounds over the last 3 months. She is exercising most days and she states hunger is controlled. Her weight is 289 lb (131.1 kg) today and has had a weight loss of 18 pounds since her last in-office visit. She has lost 38 lbs since starting treatment with [place].  Pre-Diabetes Elizabeth Gibson has a diagnosis of prediabetes based on her elevated Hgb A1c and was informed this puts her at greater risk of developing diabetes. She is stable on diet. Cyril continues to work on diet and exercise to decrease risk of diabetes. She denies nausea, vomiting or hypoglycemia. Elizabeth Gibson is due for labs.  At risk for diabetes Elizabeth Gibson is at higher than average risk for developing diabetes due to her obesity and pre-diabetes. She currently denies polyuria or polydipsia.  Hyperlipidemia Elizabeth Gibson has hyperlipidemia and she is attempting to improve her cholesterol levels with diet, exercise and weight loss. She denies any chest pain or myalgias.  Hypothyroidism Elizabeth Gibson has a diagnosis of hypothyroidism. She is stable  on synthroid 50 mcg, but she has been losing weight and this dose may not be ideal. She denies hot or cold intolerance or palpitations.   Vitamin D deficiency Elizabeth Gibson has a diagnosis of vitamin D deficiency. She is stable on vit D and denies nausea, vomiting or muscle weakness.  ASSESSMENT AND PLAN:  Prediabetes - Plan: CBC With Differential, Vitamin B12, Comprehensive metabolic panel, Folate, Hemoglobin A1c, Insulin, random  Other hyperlipidemia - Plan: Lipid Panel With LDL/HDL Ratio  Other specified hypothyroidism -  Plan: T3, T4, free, TSH  Vitamin D deficiency - Plan: Vitamin B12, VITAMIN D 25 Hydroxy (Vit-D Deficiency, Fractures)  At risk for diabetes mellitus  Class 3 severe obesity with serious comorbidity and body mass index (BMI) of 45.0 to 49.9 in adult, unspecified obesity type ([place])  PLAN:  Pre-Diabetes Elizabeth Gibson will continue to work on weight loss, exercise, and decreasing simple carbohydrates in her diet to help decrease the risk of diabetes. She was informed that eating too many simple carbohydrates or too many calories at one sitting increases the likelihood of GI side effects. We will check labs and Clora agreed to follow up with [place] as directed to monitor her progress.  Diabetes risk counseling Elizabeth Gibson was given extended (15 minutes) diabetes prevention counseling today. She is 43 y.o. female and has risk factors for diabetes including obesity and prediabetes. We discussed intensive lifestyle modifications today with an emphasis on weight loss as well as increasing exercise and decreasing simple carbohydrates in her diet.  Hyperlipidemia Elizabeth Gibson was informed of the American Heart Association Guidelines emphasizing intensive lifestyle modifications as the first line treatment for hyperlipidemia. We discussed many lifestyle modifications today in depth, and Elizabeth Gibson will continue to work on decreasing saturated fats such as fatty red meat, butter and many fried foods. She will also increase vegetables and lean protein in her diet and continue to work on exercise and weight loss efforts. We will check labs and [name] will follow up as directed.  Hypothyroidism Elizabeth Gibson was informed of the importance of  good thyroid control to help with weight loss efforts. She was also informed that supertheraputic thyroid levels are dangerous and will not improve weight loss results. We will check labs today and follow. Elizabeth Gibson will continue synthroid and she will follow up at the agreed upon time.  Vitamin D Deficiency Elizabeth Gibson  was informed that low vitamin D levels contributes to fatigue and are associated with obesity, breast, and colon cancer. She agrees to continue to take prescription Vit D @50 ,000 IU every week and will follow up for routine testing of vitamin D, at least 2-3 times per year. She was informed of the risk of over-replacement of vitamin D and agrees to not increase her dose unless she discusses this with us first. We will check labs and Elizabeth Gibson will follow up as directed.  Obesity Elizabeth Gibson is currently in the action stage of change. As such, her goal is to continue with weight loss efforts She has agreed to follow the Category 3 plan Elizabeth Gibson has been instructed to work up to a goal of 150 minutes of combined cardio and strengthening exercise per week for weight loss and overall health benefits. We discussed the following Behavioral Modification Strategies today: increasing lean protein intake, decreasing simple carbohydrates and work on meal planning and easy cooking plans  Elizabeth Gibson has agreed to follow up with our clinic in 2 weeks. She was informed of the importance of frequent follow up visits to maximize her success with intensive lifestyle modifications for her multiple health conditions.  ALLERGIES: No Known Allergies  MEDICATIONS: Current Outpatient Medications on File Prior to Visit  Medication Sig Dispense Refill   levothyroxine (SYNTHROID, LEVOTHROID) 50 MCG tablet Take 50 mcg by mouth daily before breakfast.     losartan-hydrochlorothiazide (HYZAAR) 50-12.5 MG tablet Take 1 tablet by mouth daily.     Vitamin D, Ergocalciferol, (DRISDOL) 1.25 MG (50000 UT) CAPS capsule Take 1 capsule (50,000 Units total) by mouth every 7 (seven) days. 4 capsule 0   metFORMIN (GLUCOPHAGE) 500 MG tablet Take 1 tablet (500 mg total) by mouth daily with breakfast. (Patient not taking: Reported on 12/19/2018) 30 tablet 0   No current facility-administered medications on file prior to visit.     PAST MEDICAL  HISTORY: Past Medical History:  Diagnosis Date   Anxiety    History of heart attack    Hypertension    Hypothyroidism    Kidney stone    Vitamin D deficiency     PAST SURGICAL HISTORY: Past Surgical History:  Procedure Laterality Date   CESAREAN SECTION     removal of kidney stones      SOCIAL HISTORY: Social History   Tobacco Use   Smoking status: Never Smoker   Smokeless tobacco: Never Used  Substance Use Topics   Alcohol use: No   Drug use: No    FAMILY HISTORY: Family History  Problem Relation Age of Onset   Cancer Maternal Aunt        lung   Breast cancer Maternal Aunt    Cancer Paternal Aunt        breast   Cancer Paternal Grandmother        breast   Eating disorder Mother    Hypertension Father    Stroke Father    Heart disease Father     ROS: Review of Systems  Constitutional: Positive for weight loss.  Cardiovascular: Negative for chest pain and palpitations.  Gastrointestinal: Negative for nausea and vomiting.  Genitourinary: Negative for frequency.  Musculoskeletal: Negative  for myalgias.       Negative for muscle weakness  Endo/Heme/Allergies: Negative for polydipsia.       Negative for hypoglycemia Negative for heat or cold intolerance    PHYSICAL EXAM: Blood pressure 110/73, pulse 69, temperature 98.3 F (36.8 C), height 5\' 6"  (1.676 m), weight 289 lb (131.1 kg), last menstrual period 12/05/2018, SpO2 98 %. Body mass index is 46.65 kg/m. Physical Exam Vitals signs reviewed.  Constitutional:      Appearance: Normal appearance. She is well-developed. She is obese.  Cardiovascular:     Rate and Rhythm: Normal rate.  Pulmonary:     Effort: Pulmonary effort is normal.  Musculoskeletal: Normal range of motion.  Skin:    General: Skin is warm and dry.  Neurological:     Mental Status: She is alert and oriented to person, place, and time.  Psychiatric:        Mood and Affect: Mood normal.        Behavior: Behavior  normal.     RECENT LABS AND TESTS: BMET    Component Value Date/Time   NA 138 12/19/2018 1055   K WILL FOLLOW 12/19/2018 1055   CL 103 12/19/2018 1055   CO2 21 12/19/2018 1055   GLUCOSE 105 (H) 12/19/2018 1055   GLUCOSE 86 12/27/2013 0845   BUN 17 12/19/2018 1055   CREATININE 0.55 (L) 12/19/2018 1055   CALCIUM 8.9 12/19/2018 1055   GFRNONAA 116 12/19/2018 1055   GFRAA 134 12/19/2018 1055   Lab Results  Component Value Date   HGBA1C WILL FOLLOW 12/19/2018   HGBA1C 5.7 (H) 07/14/2018   HGBA1C 5.7 12/27/2013   Lab Results  Component Value Date   INSULIN WILL FOLLOW 12/19/2018   INSULIN 27.3 (H) 07/14/2018   CBC    Component Value Date/Time   WBC 7.6 12/19/2018 1055   WBC 12.8 (H) 12/27/2013 0845   RBC 4.48 12/19/2018 1055   RBC 4.52 12/27/2013 0845   HGB 10.7 (L) 12/19/2018 1055   HCT 35.5 12/19/2018 1055   PLT 289.0 12/27/2013 0845   MCV 79 12/19/2018 1055   MCH 23.9 (L) 12/19/2018 1055   MCHC 30.1 (L) 12/19/2018 1055   MCHC 33.5 12/27/2013 0845   RDW 16.1 (H) 12/19/2018 1055   LYMPHSABS 1.6 12/19/2018 1055   MONOABS 0.7 12/27/2013 0845   EOSABS 0.2 12/19/2018 1055   BASOSABS 0.1 12/19/2018 1055   Iron/TIBC/Ferritin/ %Sat No results found for: IRON, TIBC, FERRITIN, IRONPCTSAT Lipid Panel     Component Value Date/Time   CHOL WILL FOLLOW 12/19/2018 1055   TRIG WILL FOLLOW 12/19/2018 1055   HDL WILL FOLLOW 12/19/2018 1055   CHOLHDL 3 12/27/2013 0845   VLDL 13.6 12/27/2013 0845   LDLCALC WILL FOLLOW 12/19/2018 1055   Hepatic Function Panel     Component Value Date/Time   PROT 6.8 12/19/2018 1055   ALBUMIN 4.1 12/19/2018 1055   AST 11 12/19/2018 1055   ALT 16 12/19/2018 1055   ALKPHOS 88 12/19/2018 1055   BILITOT <0.2 12/19/2018 1055   BILIDIR 0.1 12/27/2013 0845      Component Value Date/Time   TSH WILL FOLLOW 12/19/2018 1055   TSH 2.310 07/14/2018 1145   TSH 1.57 12/27/2013 0845     Ref. Range 07/14/2018 11:45  Vitamin D, 25-Hydroxy Latest  Ref Range: 30.0 - 100.0 ng/mL 7.6 (L)    OBESITY BEHAVIORAL INTERVENTION VISIT  Today's visit was # 9   Starting weight: 327 lbs Starting date: 07/14/2018 Today's weight :  289 lbs Today's date: 12/19/2018 Total lbs lost to date: 38    12/19/2018  Height 5\' 6"  (1.676 m)  Weight 289 lb (131.1 kg)  BMI (Calculated) 46.67  BLOOD PRESSURE - SYSTOLIC 110  BLOOD PRESSURE - DIASTOLIC 73   Body Fat % 45.5 %  Total Body Water (lbs) 103.2 lbs    ASK: We discussed the diagnosis of obesity with Carollee Leitzasey Bonn today and Elizabeth Gibson agreed to give us permission to discuss obesity behavioral modification therapy today.  ASSESS: Elizabeth Gibson has the diagnosis of obesity and her BMI today is 46.67 Elizabeth Gibson is in the action stage of change   ADVISE: Elizabeth Gibson was educated on the multiple health risks of obesity as well as the benefit of weight loss to improve her health. She was advised of the need for long term treatment and the importance of lifestyle modifications to improve her current health and to decrease her risk of future health problems.  AGREE: Multiple dietary modification options and treatment options were discussed and  Elizabeth Gibson agreed to follow the recommendations documented in the above note.  ARRANGE: Elizabeth Gibson was educated on the importance of frequent visits to treat obesity as outlined per CMS and USPSTF guidelines and agreed to schedule her next follow up appointment today.  I, [place] CraneJoanne Murray, am acting as transcriptionist for Quillian Quincearen Chandlar Staebell, MD  I have reviewed the above documentation for accuracy and completeness, and I agree with the above. -Quillian Quincearen Ariatna Jester, MD

## 2019-01-02 ENCOUNTER — Other Ambulatory Visit (INDEPENDENT_AMBULATORY_CARE_PROVIDER_SITE_OTHER): Payer: Self-pay | Admitting: Family Medicine

## 2019-01-02 DIAGNOSIS — E559 Vitamin D deficiency, unspecified: Secondary | ICD-10-CM

## 2019-01-03 ENCOUNTER — Ambulatory Visit (INDEPENDENT_AMBULATORY_CARE_PROVIDER_SITE_OTHER): Payer: BC Managed Care – PPO | Admitting: Family Medicine

## 2019-01-03 ENCOUNTER — Other Ambulatory Visit: Payer: Self-pay

## 2019-01-03 ENCOUNTER — Encounter (INDEPENDENT_AMBULATORY_CARE_PROVIDER_SITE_OTHER): Payer: Self-pay | Admitting: Family Medicine

## 2019-01-03 VITALS — BP 115/73 | HR 68 | Temp 97.5°F | Ht 66.0 in | Wt 286.0 lb

## 2019-01-03 DIAGNOSIS — R7303 Prediabetes: Secondary | ICD-10-CM

## 2019-01-03 DIAGNOSIS — E559 Vitamin D deficiency, unspecified: Secondary | ICD-10-CM

## 2019-01-03 DIAGNOSIS — Z9189 Other specified personal risk factors, not elsewhere classified: Secondary | ICD-10-CM | POA: Diagnosis not present

## 2019-01-03 DIAGNOSIS — Z6841 Body Mass Index (BMI) 40.0 and over, adult: Secondary | ICD-10-CM

## 2019-01-03 MED ORDER — VITAMIN D (ERGOCALCIFEROL) 1.25 MG (50000 UNIT) PO CAPS: 50000.0000 [IU] | ORAL_CAPSULE | ORAL | 0 refills | Status: DC

## 2019-01-03 NOTE — Progress Notes (Signed)
Office: [phone]  /  Fax: [phone]   HPI:   Chief Complaint: OBESITY Elizabeth Gibson is here to discuss her progress with her obesity treatment plan. She is on the Category 3 plan and is following her eating plan approximately 98% of the time. She states she is swimming/walking 40-50 minutes 6 times per week. Elizabeth Gibson continues to do well with weight loss on her Category 3 plan. Hunger is controlled and the patient is not getting bored. She gets some family support but not much.  Her weight is 286 lb (129.7 kg) today and has had a weight loss of 3 pounds over a period of 2 weeks since her last visit. She has lost 41 lbs since starting treatment with [place].  Vitamin D deficiency Elizabeth Gibson has a diagnosis of Vitamin D deficiency, which is slowly improving but not yet at goal. She is currently taking prescription Vit D and denies nausea, vomiting or muscle weakness.  Pre-Diabetes Elizabeth Gibson has a diagnosis of prediabetes based on her elevated Hgb A1c and was informed this puts her at greater risk of developing diabetes. This is improving with diet and her last A1c was 5.6 on 12/19/2018. She is not taking metformin currently and continues to work on diet and exercise to decrease risk of diabetes. She denies nausea, vomiting, or hypoglycemia.  At risk for diabetes Elizabeth Gibson is at higher than averagerisk for developing diabetes due to her obesity. She currently denies polyuria or polydipsia.  ASSESSMENT AND PLAN:  Vitamin D deficiency - Plan: Vitamin D, Ergocalciferol, (DRISDOL) 1.25 MG (50000 UT) CAPS capsule  Prediabetes  At risk for diabetes mellitus  Class 3 severe obesity with serious comorbidity and body mass index (BMI) of 45.0 to 49.9 in adult, unspecified obesity type ([place])  PLAN:  Vitamin D Deficiency Elizabeth Gibson was informed that low Vitamin D levels contributes to fatigue and are associated with obesity, breast, and colon cancer. She agrees to continue to take prescription Vit D @ 50,000 IU every week  #4 with 0 refills and will follow-up for routine testing of Vitamin D, at least 2-3 times per year. She was informed of the risk of over-replacement of Vitamin D and agrees to not increase her dose unless she discusses this with [place] first. Elizabeth Gibson agrees to follow-up with our clinic in 2-3 weeks.  Pre-Diabetes Elizabeth Gibson will continue to work on weight loss, exercise, and decreasing simple carbohydrates in her diet to help decrease the risk of diabetes. We dicussed metformin including benefits and risks. She was informed that eating too many simple carbohydrates or too many calories at one sitting increases the likelihood of GI side effects. Elizabeth Gibson will continue her diet and exercise and will follow-up with our clinic in 2-3 weeks to monitor her progress.  Diabetes risk counseling Elizabeth Gibson was given extended (15 minutes) diabetes prevention counseling today. She is 43 y.o. female and has risk factors for diabetes including obesity. We discussed intensive lifestyle modifications today with an emphasis on weight loss as well as increasing exercise and decreasing simple carbohydrates in her diet.  Obesity Elizabeth Gibson is currently in the action stage of change. As such, her goal is to continue with weight loss efforts. She has agreed to follow the Category 3 plan. Elizabeth Gibson has been instructed to work up to a goal of 150 minutes of combined cardio and strengthening exercise per week for weight loss and overall health benefits. We discussed the following Behavioral Modification Strategies today: work on meal planning, easy cooking plans, and dealing with family or  coworker sabotage.  Elizabeth LyonsCasey has agreed to follow-up with our clinic in 2-3 weeks. She was informed of the importance of frequent follow-up visits to maximize her success with intensive lifestyle modifications for her multiple health conditions.  ALLERGIES: No Known Allergies  MEDICATIONS: Current Outpatient Medications on File Prior to Visit  Medication Sig  Dispense Refill   levothyroxine (SYNTHROID, LEVOTHROID) 50 MCG tablet Take 50 mcg by mouth daily before breakfast.     losartan-hydrochlorothiazide (HYZAAR) 50-12.5 MG tablet Take 1 tablet by mouth daily.     metFORMIN (GLUCOPHAGE) 500 MG tablet Take 1 tablet (500 mg total) by mouth daily with breakfast. 30 tablet 0   No current facility-administered medications on file prior to visit.     PAST MEDICAL HISTORY: Past Medical History:  Diagnosis Date   Anxiety    History of heart attack    Hypertension    Hypothyroidism    Kidney stone    Vitamin D deficiency     PAST SURGICAL HISTORY: Past Surgical History:  Procedure Laterality Date   CESAREAN SECTION     removal of kidney stones      SOCIAL HISTORY: Social History   Tobacco Use   Smoking status: Never Smoker   Smokeless tobacco: Never Used  Substance Use Topics   Alcohol use: No   Drug use: No    FAMILY HISTORY: Family History  Problem Relation Age of Onset   Cancer Maternal Aunt        lung   Breast cancer Maternal Aunt    Cancer Paternal Aunt        breast   Cancer Paternal Grandmother        breast   Eating disorder Mother    Hypertension Father    Stroke Father    Heart disease Father    ROS: Review of Systems  Gastrointestinal: Negative for nausea and vomiting.  Musculoskeletal:       Negative for muscle weakness.  Endo/Heme/Allergies:       Negative for hypoglycemia.   PHYSICAL EXAM: Blood pressure 115/73, pulse 68, temperature (!) 97.5 F (36.4 C), temperature source Oral, height 5\' 6"  (1.676 m), weight 286 lb (129.7 kg), last menstrual period 01/03/2019, SpO2 98 %. Body mass index is 46.16 kg/m. Physical Exam Vitals signs reviewed.  Constitutional:      Appearance: Normal appearance. She is obese.  Cardiovascular:     Rate and Rhythm: Normal rate.     Pulses: Normal pulses.  Pulmonary:     Effort: Pulmonary effort is normal.     Breath sounds: Normal breath  sounds.  Musculoskeletal: Normal range of motion.  Skin:    General: Skin is warm and dry.  Neurological:     Mental Status: She is alert and oriented to person, place, and time.  Psychiatric:        Behavior: Behavior normal.   RECENT LABS AND TESTS: BMET    Component Value Date/Time   NA 138 12/19/2018 1055   K 4.8 12/19/2018 1055   CL 103 12/19/2018 1055   CO2 21 12/19/2018 1055   GLUCOSE 105 (H) 12/19/2018 1055   GLUCOSE 86 12/27/2013 0845   BUN 17 12/19/2018 1055   CREATININE 0.55 (L) 12/19/2018 1055   CALCIUM 8.9 12/19/2018 1055   GFRNONAA 116 12/19/2018 1055   GFRAA 134 12/19/2018 1055   Lab Results  Component Value Date   HGBA1C 5.6 12/19/2018   HGBA1C 5.7 (H) 07/14/2018   HGBA1C 5.7 12/27/2013   Lab  Results  Component Value Date   INSULIN 29.1 (H) 12/19/2018   INSULIN 27.3 (H) 07/14/2018   CBC    Component Value Date/Time   WBC 7.6 12/19/2018 1055   WBC 12.8 (H) 12/27/2013 0845   RBC 4.48 12/19/2018 1055   RBC 4.52 12/27/2013 0845   HGB 10.7 (L) 12/19/2018 1055   HCT 35.5 12/19/2018 1055   PLT 289.0 12/27/2013 0845   MCV 79 12/19/2018 1055   MCH 23.9 (L) 12/19/2018 1055   MCHC 30.1 (L) 12/19/2018 1055   MCHC 33.5 12/27/2013 0845   RDW 16.1 (H) 12/19/2018 1055   LYMPHSABS 1.6 12/19/2018 1055   MONOABS 0.7 12/27/2013 0845   EOSABS 0.2 12/19/2018 1055   BASOSABS 0.1 12/19/2018 1055   Iron/TIBC/Ferritin/ %Sat No results found for: IRON, TIBC, FERRITIN, IRONPCTSAT Lipid Panel     Component Value Date/Time   CHOL 161 12/19/2018 1055   TRIG 65 12/19/2018 1055   HDL 44 12/19/2018 1055   CHOLHDL 3 12/27/2013 0845   VLDL 13.6 12/27/2013 0845   LDLCALC 104 (H) 12/19/2018 1055   Hepatic Function Panel     Component Value Date/Time   PROT 6.8 12/19/2018 1055   ALBUMIN 4.1 12/19/2018 1055   AST 11 12/19/2018 1055   ALT 16 12/19/2018 1055   ALKPHOS 88 12/19/2018 1055   BILITOT <0.2 12/19/2018 1055   BILIDIR 0.1 12/27/2013 0845      Component  Value Date/Time   TSH 2.980 12/19/2018 1055   TSH 2.310 07/14/2018 1145   TSH 1.57 12/27/2013 0845   Results for [name], Ercia (MRN [id number]) as of 01/03/2019 10:41  Ref. Range 12/19/2018 10:55  Vitamin D, 25-Hydroxy Latest Ref Range: 30.0 - 100.0 ng/mL 38.9   OBESITY BEHAVIORAL INTERVENTION VISIT  Today's visit was #10  Starting weight: 327 lbs Starting date: 07/14/2018 Today's weight: 286 lbs Today's date: 01/03/2019 Total lbs lost to date: 5441  ASK: We discussed the diagnosis of obesity with Carollee Leitzasey Vonada today and Elizabeth LyonsCasey agreed to give us permission to discuss obesity behavioral modification therapy today.  ASSESS: Elizabeth LyonsCasey has the diagnosis of obesity and her BMI today is 46.3. Elizabeth LyonsCasey is in the action stage of change.   ADVISE: Elizabeth LyonsCasey was educated on the multiple health risks of obesity as well as the benefit of weight loss to improve her health. She was advised of the need for long term treatment and the importance of lifestyle modifications to improve her current health and to decrease her risk of future health problems.  AGREE: Multiple dietary modification options and treatment options were discussed and  Elizabeth LyonsCasey agreed to follow the recommendations documented in the above note.  ARRANGE: Elizabeth LyonsCasey was educated on the importance of frequent visits to treat obesity as outlined per CMS and USPSTF guidelines and agreed to schedule her next follow up appointment today.  I, Marianna Paymentenise Haag, am acting as Energy managertranscriptionist for Quillian Quincearen Saquoia Sianez, MD  I have reviewed the above documentation for accuracy and completeness, and I agree with the above. -Quillian Quincearen Ellington Cornia, MD

## 2019-01-24 ENCOUNTER — Other Ambulatory Visit: Payer: Self-pay

## 2019-01-24 ENCOUNTER — Ambulatory Visit (INDEPENDENT_AMBULATORY_CARE_PROVIDER_SITE_OTHER): Payer: BC Managed Care – PPO | Admitting: Family Medicine

## 2019-01-24 ENCOUNTER — Encounter (INDEPENDENT_AMBULATORY_CARE_PROVIDER_SITE_OTHER): Payer: Self-pay | Admitting: Family Medicine

## 2019-01-24 VITALS — BP 101/63 | HR 72 | Temp 97.6°F | Ht 66.0 in | Wt 279.0 lb

## 2019-01-24 DIAGNOSIS — I10 Essential (primary) hypertension: Secondary | ICD-10-CM | POA: Diagnosis not present

## 2019-01-24 DIAGNOSIS — Z6841 Body Mass Index (BMI) 40.0 and over, adult: Secondary | ICD-10-CM | POA: Diagnosis not present

## 2019-01-24 NOTE — Progress Notes (Signed)
Office: [phone]  /  Fax: [phone]   HPI:   Chief Complaint: OBESITY Elizabeth Gibson is here to discuss her progress with her obesity treatment plan. She is on the Category 3 plan and is following her eating plan approximately 98% of the time. She states she is running/walking 3 miles/swimming 50 minutes 6 times per week. Elizabeth Gibson continues to do well with weight loss on her Category 3 plan. She states her hunger is controlled and she has increased her exercise. She is working on meal planning and prepping and doing well meeting her protein and vegetable goals. Her weight is 279 lb (126.6 kg) today and has had a weight loss of 7 pounds over a period of 3 weeks since her last visit. She has lost 48 lbs since starting treatment with us.  Hypertension Elizabeth Gibson is a 43 y.o. female with hypertension and is on Hyzaar.  Elizabeth Gibson denies chest pain or shortness of breath on exertion. She is doing well on diet and weight loss to help control her blood pressure with the goal of decreasing her risk of heart attack and stroke.  ASSESSMENT AND PLAN:  Essential hypertension  Class 3 severe obesity with serious comorbidity and body mass index (BMI) of 45.0 to 49.9 in adult, unspecified obesity type (HCC)  PLAN:  Hypertension We discussed sodium restriction, working on healthy weight loss, and a regular exercise program as the means to achieve improved blood pressure control. Elizabeth Gibson agreed with this plan and agreed to follow up as directed. We will continue to monitor her blood pressure as well as her progress with the above lifestyle modifications. Elizabeth Gibson will increase her water intake and zero calorie electrolytes. She will continue her medications as prescribed and will watch for signs of hypotension as she continues her lifestyle modifications.  I spent > than 50% of the 25 minute visit on counseling as documented in the note.  Obesity Elizabeth Gibson is currently in the action stage of change. As such, her  goal is to continue with weight loss efforts. She has agreed to follow the Category 3 plan. Elizabeth Gibson has been instructed to work up to a goal of 150 minutes of combined cardio and strengthening exercise per week for weight loss and overall health benefits. We discussed the following Behavioral Modification StratEgies today: increasing lean protein intake and no skipping meals.  Elizabeth Gibson has agreed to follow-up with our clinic in 2-3 weeks. She was informed of the importance of frequent follow-up visits to maximize her success with intensive lifestyle modifications for her multiple health conditions.  ALLERGIES: No Known Allergies  MEDICATIONS: Current Outpatient Medications on File Prior to Visit  Medication Sig Dispense Refill  . levothyroxine (SYNTHROID, LEVOTHROID) 50 MCG tablet Take 50 mcg by mouth daily before breakfast.    . losartan-hydrochlorothiazide (HYZAAR) 50-12.5 MG tablet Take 1 tablet by mouth daily.    . metFORMIN (GLUCOPHAGE) 500 MG tablet Take 1 tablet (500 mg total) by mouth daily with breakfast. 30 tablet 0  . Vitamin D, Ergocalciferol, (DRISDOL) 1.25 MG (50000 UT) CAPS capsule Take 1 capsule (50,000 Units total) by mouth every 7 (seven) days. 4 capsule 0   No current facility-administered medications on file prior to visit.     PAST MEDICAL HISTORY: Past Medical History:  Diagnosis Date  . Anxiety   . History of heart attack   . Hypertension   . Hypothyroidism   . Kidney stone   . Vitamin D deficiency     PAST SURGICAL HISTORY: Past  Surgical History:  Procedure Laterality Date  . CESAREAN SECTION    . removal of kidney stones      SOCIAL HISTORY: Social History   Tobacco Use  . Smoking status: Never Smoker  . Smokeless tobacco: Never Used  Substance Use Topics  . Alcohol use: No  . Drug use: No    FAMILY HISTORY: Family History  Problem Relation Age of Onset  . Cancer Maternal Aunt        lung  . Breast cancer Maternal Aunt   . Cancer Paternal  Aunt        breast  . Cancer Paternal Grandmother        breast  . Eating disorder Mother   . Hypertension Father   . Stroke Father   . Heart disease Father    ROS: ROS none noted.  PHYSICAL EXAM: Blood pressure 101/63, pulse 72, temperature 97.6 F (36.4 C), temperature source Oral, height 5\' 6"  (1.676 m), weight 279 lb (126.6 kg), last menstrual period 01/03/2019, SpO2 98 %. Body mass index is 45.03 kg/m. Physical Exam Vitals signs reviewed.  Constitutional:      Appearance: Normal appearance. She is obese.  Cardiovascular:     Rate and Rhythm: Normal rate.     Pulses: Normal pulses.  Pulmonary:     Effort: Pulmonary effort is normal.     Breath sounds: Normal breath sounds.  Musculoskeletal: Normal range of motion.  Skin:    General: Skin is warm and dry.  Neurological:     Mental Status: She is alert and oriented to person, place, and time.  Psychiatric:        Behavior: Behavior normal.   RECENT LABS AND TESTS: BMET    Component Value Date/Time   NA 138 12/19/2018 1055   K 4.8 12/19/2018 1055   CL 103 12/19/2018 1055   CO2 21 12/19/2018 1055   GLUCOSE 105 (H) 12/19/2018 1055   GLUCOSE 86 12/27/2013 0845   BUN 17 12/19/2018 1055   CREATININE 0.55 (L) 12/19/2018 1055   CALCIUM 8.9 12/19/2018 1055   GFRNONAA 116 12/19/2018 1055   GFRAA 134 12/19/2018 1055   Lab Results  Component Value Date   HGBA1C 5.6 12/19/2018   HGBA1C 5.7 (H) 07/14/2018   HGBA1C 5.7 12/27/2013   Lab Results  Component Value Date   INSULIN 29.1 (H) 12/19/2018   INSULIN 27.3 (H) 07/14/2018   CBC    Component Value Date/Time   WBC 7.6 12/19/2018 1055   WBC 12.8 (H) 12/27/2013 0845   RBC 4.48 12/19/2018 1055   RBC 4.52 12/27/2013 0845   HGB 10.7 (L) 12/19/2018 1055   HCT 35.5 12/19/2018 1055   PLT 289.0 12/27/2013 0845   MCV 79 12/19/2018 1055   MCH 23.9 (L) 12/19/2018 1055   MCHC 30.1 (L) 12/19/2018 1055   MCHC 33.5 12/27/2013 0845   RDW 16.1 (H) 12/19/2018 1055    LYMPHSABS 1.6 12/19/2018 1055   MONOABS 0.7 12/27/2013 0845   EOSABS 0.2 12/19/2018 1055   BASOSABS 0.1 12/19/2018 1055   Iron/TIBC/Ferritin/ %Sat No results found for: IRON, TIBC, FERRITIN, IRONPCTSAT Lipid Panel     Component Value Date/Time   CHOL 161 12/19/2018 1055   TRIG 65 12/19/2018 1055   HDL 44 12/19/2018 1055   CHOLHDL 3 12/27/2013 0845   VLDL 13.6 12/27/2013 0845   LDLCALC 104 (H) 12/19/2018 1055   Hepatic Function Panel     Component Value Date/Time   PROT 6.8 12/19/2018 1055  ALBUMIN 4.1 12/19/2018 1055   AST 11 12/19/2018 1055   ALT 16 12/19/2018 1055   ALKPHOS 88 12/19/2018 1055   BILITOT <0.2 12/19/2018 1055   BILIDIR 0.1 12/27/2013 0845      Component Value Date/Time   TSH 2.980 12/19/2018 1055   TSH 2.310 07/14/2018 1145   TSH 1.57 12/27/2013 0845   Results for Elizabeth Gibson, Elizabeth Gibson (MRN [id number]) as of 01/24/2019 11:20  Ref. Range 12/19/2018 10:55  Vitamin D, 25-Hydroxy Latest Ref Range: 30.0 - 100.0 ng/mL 38.9   OBESITY BEHAVIORAL INTERVENTION VISIT  Today's visit was #11  Starting weight: 327 lbs Starting date: 07/14/2018 Today's weight: 279 lbs  Today's date: 01/24/2019 Total lbs lost to date: 48   01/24/2019  Height 5\' 6"  (1.676 m)  Weight 279 lb (126.6 kg)  BMI (Calculated) 45.05  BLOOD PRESSURE - SYSTOLIC 101  BLOOD PRESSURE - DIASTOLIC 63   Body Fat % 46.9 %  Total Body Water (lbs) 99.4 lbs   ASK: We discussed the diagnosis of obesity with Elizabeth Leitzasey Schoch today and Elizabeth Gibson agreed to give us permission to discuss obesity behavioral modification therapy today.  ASSESS: Elizabeth Gibson has the diagnosis of obesity and her BMI today is 45.1. Elizabeth Gibson is in the action stage of change.   ADVISE: Elizabeth Gibson was educated on the multiple health risks of obesity as well as the benefit of weight loss to improve her health. She was advised of the need for long term treatment and the importance of lifestyle modifications to improve her current health and to decrease her  risk of future health problems.  AGREE: Multiple dietary modification options and treatment options were discussed and  Elizabeth Gibson agreed to follow the recommendations documented in the above note.  ARRANGE: Elizabeth Gibson was educated on the importance of frequent visits to treat obesity as outlined per CMS and USPSTF guidelines and agreed to schedule her next follow up appointment today.  I, Marianna Paymentenise Haag, am acting as Energy managertranscriptionist for Quillian Quincearen Manvir Thorson, MD  I have reviewed the above documentation for accuracy and completeness, and I agree with the above. -Quillian Quincearen Lenoria Narine, MD

## 2019-01-30 ENCOUNTER — Other Ambulatory Visit (INDEPENDENT_AMBULATORY_CARE_PROVIDER_SITE_OTHER): Payer: Self-pay | Admitting: Family Medicine

## 2019-01-30 DIAGNOSIS — E559 Vitamin D deficiency, unspecified: Secondary | ICD-10-CM

## 2019-02-13 ENCOUNTER — Other Ambulatory Visit: Payer: Self-pay

## 2019-02-13 ENCOUNTER — Encounter (INDEPENDENT_AMBULATORY_CARE_PROVIDER_SITE_OTHER): Payer: Self-pay | Admitting: Family Medicine

## 2019-02-13 ENCOUNTER — Ambulatory Visit (INDEPENDENT_AMBULATORY_CARE_PROVIDER_SITE_OTHER): Payer: BC Managed Care – PPO | Admitting: Family Medicine

## 2019-02-13 VITALS — BP 104/70 | HR 64 | Temp 97.8°F | Ht 66.0 in | Wt 278.0 lb

## 2019-02-13 DIAGNOSIS — Z9189 Other specified personal risk factors, not elsewhere classified: Secondary | ICD-10-CM | POA: Diagnosis not present

## 2019-02-13 DIAGNOSIS — E559 Vitamin D deficiency, unspecified: Secondary | ICD-10-CM

## 2019-02-13 DIAGNOSIS — E038 Other specified hypothyroidism: Secondary | ICD-10-CM

## 2019-02-13 DIAGNOSIS — Z6841 Body Mass Index (BMI) 40.0 and over, adult: Secondary | ICD-10-CM

## 2019-02-13 DIAGNOSIS — I10 Essential (primary) hypertension: Secondary | ICD-10-CM | POA: Diagnosis not present

## 2019-02-13 MED ORDER — VITAMIN D (ERGOCALCIFEROL) 1.25 MG (50000 UNIT) PO CAPS: 50000.0000 [IU] | ORAL_CAPSULE | ORAL | 0 refills | Status: DC

## 2019-02-13 MED ORDER — LEVOTHYROXINE SODIUM 50 MCG PO TABS: 50.0000 ug | ORAL_TABLET | Freq: Every day | ORAL | 0 refills | Status: DC

## 2019-02-13 MED ORDER — LOSARTAN POTASSIUM-HCTZ 50-12.5 MG PO TABS: 1.0000 | ORAL_TABLET | Freq: Every day | ORAL | 0 refills | Status: DC

## 2019-02-14 NOTE — Progress Notes (Signed)
Office: [phone]  /  Fax: [phone]   HPI:   Chief Complaint: OBESITY Elizabeth Gibson is here to discuss her progress with her obesity treatment plan. She is on the Category 3 plan and is following her eating plan approximately 98% of the time. She states she is swimming/running 40-50 minutes 6 times per week. Elizabeth Gibson continues to do well with weight loss. Hunger is mostly controlled and she is doing very well with exercise most days of the week. Her weight is 278 lb (126.1 kg) today and has had a weight loss of 1 pound over a period of 3 weeks since her last visit. She has lost 49 lbs since starting treatment with us.  Vitamin D deficiency Elizabeth Gibson has a diagnosis of Vitamin D deficiency, which is stable but not yet at goal. Last Vitamin D was 38.9 on 12/19/2018. She is currently taking prescription Vit D and denies nausea, vomiting or muscle weakness.  At risk for osteopenia and osteoporosis Elizabeth Gibson is at higher risk of osteopenia and osteoporosis due to Vitamin D deficiency.   Hypothyroidism Elizabeth Gibson has a diagnosis of hypothyroidism and is stable on levothyroxine. She denies palpitations or fatigue.  Hypertension Elizabeth Gibson is a 43 y.o. female with hypertension and is on losartan-HCTZ.  Elizabeth Gibson denies chest pain, headache, or dizziness. She is working weight loss to help control her blood pressure with the goal of decreasing her risk of heart attack and stroke. Sejla's blood pressure is very well controlled on diet and medications.  ASSESSMENT AND PLAN:  Vitamin D deficiency - Plan: Vitamin D, Ergocalciferol, (DRISDOL) 1.25 MG (50000 UT) CAPS capsule  Other specified hypothyroidism - Plan: levothyroxine (SYNTHROID) 50 MCG tablet  Essential hypertension - Plan: losartan-hydrochlorothiazide (HYZAAR) 50-12.5 MG tablet  At risk for osteoporosis  Class 3 severe obesity with serious comorbidity and body mass index (BMI) of 45.0 to 49.9 in adult, unspecified obesity type (HCC)  PLAN:   Vitamin D Deficiency Elizabeth Gibson that low Vitamin D levels contributes to fatigue and are associated with obesity, breast, and colon cancer. She agrees to continue to take prescription Vit D @ 50,000 IU every week #4 with 0 refills and will follow-up for routine testing of Vitamin D, at least 2-3 times per year. She was Gibson of the risk of over-replacement of Vitamin D and agrees to not increase her dose unless she discusses this with us first. Elizabeth Gibson agrees to follow-up with our clinic in 2-3 weeks.  At risk for osteopenia and osteoporosis Elizabeth Gibson was given extended  (15 minutes) osteoporosis prevention counseling today. Elizabeth Gibson is at risk for osteopenia and osteoporosis due to her Vitamin D deficiency. She was encouraged to take her Vitamin D and follow her higher calcium diet and increase strengthening exercise to help strengthen her bones and decrease her risk of osteopenia and osteoporosis.  Hypothyroidism Elizabeth Gibson of the importance of good thyroid control to help with weight loss efforts. She was also Gibson that supertheraputic thyroid levels are dangerous and will not improve weight loss results. Elizabeth Gibson was given a refill on her levothyroxine 50 mcg #30 with 0 refills and agrees to follow-up with our clinic in 2-3 weeks.  Hypertension We discussed sodium restriction, working on healthy weight loss, and a regular exercise program as the means to achieve improved blood pressure control. Elizabeth Gibson agreed with this plan and agreed to follow up as directed. We will continue to monitor her blood pressure as well as her progress with the above lifestyle modifications. Elizabeth Gibson  was given a refill on her losartan-HCTZ 50-12.5 mg #30 with 0 refills and agrees to follow-up with our clinic in 2-3 weeks.  She will watch for signs of hypotension as she continues her lifestyle modifications.  Obesity Elizabeth Gibson is currently in the action stage of change. As such, her goal is to continue with weight  loss efforts. She has agreed to follow the Category 3 plan. Elizabeth Gibson has been instructed to work up to a goal of 150 minutes of combined cardio and strengthening exercise per week for weight loss and overall health benefits. We discussed the following Behavioral Modification Strategies today: increasing lean protein intake, decreasing simple carbohydrates, no skipping meals, and emotional eating strategies.  Elizabeth Gibson has agreed to follow-up with our clinic in 2-3 weeks. She was Gibson of the importance of frequent follow-up visits to maximize her success with intensive lifestyle modifications for her multiple health conditions.  ALLERGIES: No Known Allergies  MEDICATIONS: Current Outpatient Medications on File Prior to Visit  Medication Sig Dispense Refill  . metFORMIN (GLUCOPHAGE) 500 MG tablet Take 1 tablet (500 mg total) by mouth daily with breakfast. 30 tablet 0   No current facility-administered medications on file prior to visit.     PAST MEDICAL HISTORY: Past Medical History:  Diagnosis Date  . Anxiety   . History of heart attack   . Hypertension   . Hypothyroidism   . Kidney stone   . Vitamin D deficiency     PAST SURGICAL HISTORY: Past Surgical History:  Procedure Laterality Date  . CESAREAN SECTION    . removal of kidney stones      SOCIAL HISTORY: Social History   Tobacco Use  . Smoking status: Never Smoker  . Smokeless tobacco: Never Used  Substance Use Topics  . Alcohol use: No  . Drug use: No    FAMILY HISTORY: Family History  Problem Relation Age of Onset  . Cancer Maternal Aunt        lung  . Breast cancer Maternal Aunt   . Cancer Paternal Aunt        breast  . Cancer Paternal Grandmother        breast  . Eating disorder Mother   . Hypertension Father   . Stroke Father   . Heart disease Father    ROS: Review of Systems  Constitutional: Negative for malaise/fatigue.  Cardiovascular: Negative for chest pain and palpitations.   Gastrointestinal: Negative for nausea and vomiting.  Musculoskeletal:       Negative for muscle weakness.  Neurological: Negative for dizziness and headaches.   PHYSICAL EXAM: Blood pressure 104/70, pulse 64, temperature 97.8 F (36.6 C), temperature source Oral, height 5\' 6"  (1.676 m), weight 278 lb (126.1 kg), last menstrual period 02/07/2019, SpO2 97 %. Body mass index is 44.87 kg/m. Physical Exam Vitals signs reviewed.  Constitutional:      Appearance: Normal appearance. She is obese.  Cardiovascular:     Rate and Rhythm: Normal rate.     Pulses: Normal pulses.  Pulmonary:     Effort: Pulmonary effort is normal.     Breath sounds: Normal breath sounds.  Musculoskeletal: Normal range of motion.  Skin:    General: Skin is warm and dry.  Neurological:     Mental Status: She is alert and oriented to person, place, and time.  Psychiatric:        Behavior: Behavior normal.   RECENT LABS AND TESTS: BMET    Component Value Date/Time   NA 138  12/19/2018 1055   K 4.8 12/19/2018 1055   CL 103 12/19/2018 1055   CO2 21 12/19/2018 1055   GLUCOSE 105 (H) 12/19/2018 1055   GLUCOSE 86 12/27/2013 0845   BUN 17 12/19/2018 1055   CREATININE 0.55 (L) 12/19/2018 1055   CALCIUM 8.9 12/19/2018 1055   GFRNONAA 116 12/19/2018 1055   GFRAA 134 12/19/2018 1055   Lab Results  Component Value Date   HGBA1C 5.6 12/19/2018   HGBA1C 5.7 (H) 07/14/2018   HGBA1C 5.7 12/27/2013   Lab Results  Component Value Date   INSULIN 29.1 (H) 12/19/2018   INSULIN 27.3 (H) 07/14/2018   CBC    Component Value Date/Time   WBC 7.6 12/19/2018 1055   WBC 12.8 (H) 12/27/2013 0845   RBC 4.48 12/19/2018 1055   RBC 4.52 12/27/2013 0845   HGB 10.7 (L) 12/19/2018 1055   HCT 35.5 12/19/2018 1055   PLT 289.0 12/27/2013 0845   MCV 79 12/19/2018 1055   MCH 23.9 (L) 12/19/2018 1055   MCHC 30.1 (L) 12/19/2018 1055   MCHC 33.5 12/27/2013 0845   RDW 16.1 (H) 12/19/2018 1055   LYMPHSABS 1.6 12/19/2018 1055    MONOABS 0.7 12/27/2013 0845   EOSABS 0.2 12/19/2018 1055   BASOSABS 0.1 12/19/2018 1055   Iron/TIBC/Ferritin/ %Sat No results found for: IRON, TIBC, FERRITIN, IRONPCTSAT Lipid Panel     Component Value Date/Time   CHOL 161 12/19/2018 1055   TRIG 65 12/19/2018 1055   HDL 44 12/19/2018 1055   CHOLHDL 3 12/27/2013 0845   VLDL 13.6 12/27/2013 0845   LDLCALC 104 (H) 12/19/2018 1055   Hepatic Function Panel     Component Value Date/Time   PROT 6.8 12/19/2018 1055   ALBUMIN 4.1 12/19/2018 1055   AST 11 12/19/2018 1055   ALT 16 12/19/2018 1055   ALKPHOS 88 12/19/2018 1055   BILITOT <0.2 12/19/2018 1055   BILIDIR 0.1 12/27/2013 0845      Component Value Date/Time   TSH 2.980 12/19/2018 1055   TSH 2.310 07/14/2018 1145   TSH 1.57 12/27/2013 0845   Results for Elizabeth Gibson, Elizabeth Gibson (MRN [id number]) as of 02/14/2019 09:56  Ref. Range 12/19/2018 10:55  Vitamin D, 25-Hydroxy Latest Ref Range: 30.0 - 100.0 ng/mL 38.9   OBESITY BEHAVIORAL INTERVENTION VISIT  Today's visit was #12   Starting weight: 327 lbs Starting date: 07/14/2018 Today's weight: 278 lbs Today's date: 02/13/2019 Total lbs lost to date: 49    02/13/2019  Height 5\' 6"  (1.676 m)  Weight 278 lb (126.1 kg)  BMI (Calculated) 44.89  BLOOD PRESSURE - SYSTOLIC 104  BLOOD PRESSURE - DIASTOLIC 70   Body Fat % 46.9 %  Total Body Water (lbs) 103.2 lbs   ASK: We discussed the diagnosis of obesity with Elizabeth Leitzasey Tribbey today and Elizabeth Gibson agreed to give us permission to discuss obesity behavioral modification therapy today.  ASSESS: Elizabeth Gibson has the diagnosis of obesity and her BMI today is 45.0. Elizabeth Gibson is in the action stage of change.   ADVISE: Elizabeth Gibson was educated on the multiple health risks of obesity as well as the benefit of weight loss to improve her health. She was advised of the need for long term treatment and the importance of lifestyle modifications to improve her current health and to decrease her risk of future health problems.   AGREE: Multiple dietary modification options and treatment options were discussed and  Elizabeth Gibson agreed to follow the recommendations documented in the above note.  ARRANGE: Elizabeth Gibson was educated on  the importance of frequent visits to treat obesity as outlined per CMS and USPSTF guidelines and agreed to schedule her next follow up appointment today.  I, Marianna Paymentenise Haag, am acting as Energy managertranscriptionist for Quillian Quincearen Beasley, MD I have reviewed the above documentation for accuracy and completeness, and I agree with the above. -Quillian Quincearen Beasley, MD

## 2019-02-27 ENCOUNTER — Ambulatory Visit (INDEPENDENT_AMBULATORY_CARE_PROVIDER_SITE_OTHER): Payer: BC Managed Care – PPO | Admitting: Family Medicine

## 2019-02-27 ENCOUNTER — Other Ambulatory Visit: Payer: Self-pay

## 2019-02-27 ENCOUNTER — Encounter (INDEPENDENT_AMBULATORY_CARE_PROVIDER_SITE_OTHER): Payer: Self-pay | Admitting: Family Medicine

## 2019-02-27 VITALS — BP 112/75 | HR 65 | Temp 97.5°F | Ht 66.0 in | Wt 277.0 lb

## 2019-02-27 DIAGNOSIS — Z6841 Body Mass Index (BMI) 40.0 and over, adult: Secondary | ICD-10-CM

## 2019-02-27 DIAGNOSIS — R600 Localized edema: Secondary | ICD-10-CM

## 2019-02-27 DIAGNOSIS — E66813 Obesity, class 3: Secondary | ICD-10-CM

## 2019-02-27 NOTE — Progress Notes (Signed)
Office: [phone]  /  Fax: [phone]   HPI:   Chief Complaint: OBESITY Elizabeth Gibson is here to discuss her progress with her obesity treatment plan. She is on the Category 3 plan and is following her eating plan approximately 98 % of the time. She states she is swimming, walking, and jogging for 40 minutes 6 times per week. Elizabeth Gibson continues to do well with weight loss. Her hunger is controlled and she is happy with her Category 3 plan. She is open to looking at additional dinner options.  Her weight is 277 lb (125.6 kg) today and has had a weight loss of 1 pound over a period of 2 weeks since her last visit. She has lost 50 lbs since starting treatment with [place].  Lower Extremity Edema Elizabeth Gibson notes increased lower extremity edema, and 1+ pitty bilateral. This seems to be worse since working from home and sitting much more. She is trying to increase H20 intake. She denies worsening shortness of breath or orthopnea.  ASSESSMENT AND PLAN:  Lower extremity edema  Class 3 severe obesity with serious comorbidity and body mass index (BMI) of 40.0 to 44.9 in adult, unspecified obesity type ([place])  PLAN:  Lower Extremity Edema We discussed the importance of moving around every hour, and continue to increase H20 intake and continue to monitor. Elizabeth Gibson agrees to follow up with our clinic in 2 to 3 weeks.  I spent > than 50% of the 15 minute visit on counseling as documented in the note.  Obesity Elizabeth Gibson is currently in the action stage of change. As such, her goal is to continue with weight loss efforts She has agreed to keep a food journal with 400-600 calories and 40+ grams of protein at supper daily and follow the Category 3 plan Elizabeth Gibson has been instructed to work up to a goal of 150 minutes of combined cardio and strengthening exercise per week for weight loss and overall health benefits. We discussed the following Behavioral Modification Strategies today: increasing lean protein intake and increase  H20 intake   Elizabeth Gibson has agreed to follow up with our clinic in 2 to 3 weeks. She was informed of the importance of frequent follow up visits to maximize her success with intensive lifestyle modifications for her multiple health conditions.  ALLERGIES: No Known Allergies  MEDICATIONS: Current Outpatient Medications on File Prior to Visit  Medication Sig Dispense Refill  . levothyroxine (SYNTHROID) 50 MCG tablet Take 1 tablet (50 mcg total) by mouth daily before breakfast. 30 tablet 0  . losartan-hydrochlorothiazide (HYZAAR) 50-12.5 MG tablet Take 1 tablet by mouth daily. 30 tablet 0  . Vitamin D, Ergocalciferol, (DRISDOL) 1.25 MG (50000 UT) CAPS capsule Take 1 capsule (50,000 Units total) by mouth every 7 (seven) days. 4 capsule 0  . metFORMIN (GLUCOPHAGE) 500 MG tablet Take 1 tablet (500 mg total) by mouth daily with breakfast. (Patient not taking: Reported on 02/27/2019) 30 tablet 0   No current facility-administered medications on file prior to visit.     PAST MEDICAL HISTORY: Past Medical History:  Diagnosis Date  . Anxiety   . History of heart attack   . Hypertension   . Hypothyroidism   . Kidney stone   . Vitamin D deficiency     PAST SURGICAL HISTORY: Past Surgical History:  Procedure Laterality Date  . CESAREAN SECTION    . removal of kidney stones      SOCIAL HISTORY: Social History   Tobacco Use  . Smoking status: Never Smoker  .  Smokeless tobacco: Never Used  Substance Use Topics  . Alcohol use: No  . Drug use: No    FAMILY HISTORY: Family History  Problem Relation Age of Onset  . Cancer Maternal Aunt        lung  . Breast cancer Maternal Aunt   . Cancer Paternal Aunt        breast  . Cancer Paternal Grandmother        breast  . Eating disorder Mother   . Hypertension Father   . Stroke Father   . Heart disease Father     ROS: Review of Systems  Constitutional: Positive for weight loss.  Respiratory: Negative for shortness of breath.    Cardiovascular: Negative for orthopnea.    PHYSICAL EXAM: Blood pressure 112/75, pulse 65, temperature (!) 97.5 F (36.4 C), temperature source Oral, height 5\' 6"  (1.676 m), weight 277 lb (125.6 kg), last menstrual period 02/24/2019, SpO2 100 %. Body mass index is 44.71 kg/m. Physical Exam Vitals signs reviewed.  Constitutional:      Appearance: Normal appearance. She is obese.  Cardiovascular:     Rate and Rhythm: Normal rate.     Pulses: Normal pulses.  Pulmonary:     Effort: Pulmonary effort is normal.     Breath sounds: Normal breath sounds.  Musculoskeletal: Normal range of motion.     Right lower leg: Edema present.     Left lower leg: Edema present.  Skin:    General: Skin is warm and dry.  Neurological:     Mental Status: She is alert and oriented to person, place, and time.  Psychiatric:        Mood and Affect: Mood normal.        Behavior: Behavior normal.     RECENT LABS AND TESTS: BMET    Component Value Date/Time   NA 138 12/19/2018 1055   K 4.8 12/19/2018 1055   CL 103 12/19/2018 1055   CO2 21 12/19/2018 1055   GLUCOSE 105 (H) 12/19/2018 1055   GLUCOSE 86 12/27/2013 0845   BUN 17 12/19/2018 1055   CREATININE 0.55 (L) 12/19/2018 1055   CALCIUM 8.9 12/19/2018 1055   GFRNONAA 116 12/19/2018 1055   GFRAA 134 12/19/2018 1055   Lab Results  Component Value Date   HGBA1C 5.6 12/19/2018   HGBA1C 5.7 (H) 07/14/2018   HGBA1C 5.7 12/27/2013   Lab Results  Component Value Date   INSULIN 29.1 (H) 12/19/2018   INSULIN 27.3 (H) 07/14/2018   CBC    Component Value Date/Time   WBC 7.6 12/19/2018 1055   WBC 12.8 (H) 12/27/2013 0845   RBC 4.48 12/19/2018 1055   RBC 4.52 12/27/2013 0845   HGB 10.7 (L) 12/19/2018 1055   HCT 35.5 12/19/2018 1055   PLT 289.0 12/27/2013 0845   MCV 79 12/19/2018 1055   MCH 23.9 (L) 12/19/2018 1055   MCHC 30.1 (L) 12/19/2018 1055   MCHC 33.5 12/27/2013 0845   RDW 16.1 (H) 12/19/2018 1055   LYMPHSABS 1.6 12/19/2018 1055    MONOABS 0.7 12/27/2013 0845   EOSABS 0.2 12/19/2018 1055   BASOSABS 0.1 12/19/2018 1055   Iron/TIBC/Ferritin/ %Sat No results found for: IRON, TIBC, FERRITIN, IRONPCTSAT Lipid Panel     Component Value Date/Time   CHOL 161 12/19/2018 1055   TRIG 65 12/19/2018 1055   HDL 44 12/19/2018 1055   CHOLHDL 3 12/27/2013 0845   VLDL 13.6 12/27/2013 0845   LDLCALC 104 (H) 12/19/2018 1055   Hepatic  Function Panel     Component Value Date/Time   PROT 6.8 12/19/2018 1055   ALBUMIN 4.1 12/19/2018 1055   AST 11 12/19/2018 1055   ALT 16 12/19/2018 1055   ALKPHOS 88 12/19/2018 1055   BILITOT <0.2 12/19/2018 1055   BILIDIR 0.1 12/27/2013 0845      Component Value Date/Time   TSH 2.980 12/19/2018 1055   TSH 2.310 07/14/2018 1145   TSH 1.57 12/27/2013 0845      OBESITY BEHAVIORAL INTERVENTION VISIT  Today's visit was # 13   Starting weight: 327 Starting date: 07/14/2018 Today's weight : 277 lbs Today's date: 02/27/2019 Total lbs lost to date: 50    ASK: We discussed the diagnosis of obesity with Elizabeth Gibson today and Elizabeth Gibson agreed to give us permission to discuss obesity behavioral modification therapy today.  ASSESS: Elizabeth Gibson has the diagnosis of obesity and her BMI today is 44.73 Elizabeth Gibson is in the action stage of change   ADVISE: Elizabeth Gibson was educated on the multiple health risks of obesity as well as the benefit of weight loss to improve her health. She was advised of the need for long term treatment and the importance of lifestyle modifications to improve her current health and to decrease her risk of future health problems.  AGREE: Multiple dietary modification options and treatment options were discussed and  Elizabeth Gibson agreed to follow the recommendations documented in the above note.  ARRANGE: Elizabeth Gibson was educated on the importance of frequent visits to treat obesity as outlined per CMS and USPSTF guidelines and agreed to schedule her next follow up appointment today.  I, Burt KnackSharon  Martin, am acting as transcriptionist for Quillian Quincearen Cyndra Feinberg, MD  I have reviewed the above documentation for accuracy and completeness, and I agree with the above. -Quillian Quincearen Skyanne Welle, MD

## 2019-03-11 ENCOUNTER — Other Ambulatory Visit (INDEPENDENT_AMBULATORY_CARE_PROVIDER_SITE_OTHER): Payer: Self-pay | Admitting: Family Medicine

## 2019-03-11 DIAGNOSIS — E559 Vitamin D deficiency, unspecified: Secondary | ICD-10-CM

## 2019-03-23 ENCOUNTER — Other Ambulatory Visit: Payer: Self-pay

## 2019-03-23 ENCOUNTER — Ambulatory Visit (INDEPENDENT_AMBULATORY_CARE_PROVIDER_SITE_OTHER): Payer: BC Managed Care – PPO | Admitting: Family Medicine

## 2019-03-23 ENCOUNTER — Encounter (INDEPENDENT_AMBULATORY_CARE_PROVIDER_SITE_OTHER): Payer: Self-pay | Admitting: Family Medicine

## 2019-03-23 VITALS — BP 115/63 | HR 67 | Temp 98.2°F | Ht 66.0 in | Wt 276.0 lb

## 2019-03-23 DIAGNOSIS — Z6841 Body Mass Index (BMI) 40.0 and over, adult: Secondary | ICD-10-CM | POA: Diagnosis not present

## 2019-03-23 DIAGNOSIS — E559 Vitamin D deficiency, unspecified: Secondary | ICD-10-CM

## 2019-03-23 DIAGNOSIS — Z9189 Other specified personal risk factors, not elsewhere classified: Secondary | ICD-10-CM | POA: Diagnosis not present

## 2019-03-23 DIAGNOSIS — E66813 Obesity, class 3: Secondary | ICD-10-CM

## 2019-03-23 MED ORDER — VITAMIN D (ERGOCALCIFEROL) 1.25 MG (50000 UNIT) PO CAPS: 50000.0000 [IU] | ORAL_CAPSULE | ORAL | 0 refills | Status: DC

## 2019-03-26 NOTE — Progress Notes (Signed)
Office: [phone]  /  Fax: [phone]   HPI:   Chief Complaint: OBESITY Elizabeth Gibson is here to discuss her progress with her obesity treatment plan. She is on the keep a food journal with 400-600 calories and 40+ grams of protein at supper daily and follow the Category 3 plan and is following her eating plan approximately 98 % of the time. She states she is walking and swimming for 40 minutes 3 times per week. Elizabeth Gibson continues to work on weight loss. She is happy with her eating plan and states her hunger is controlled. She has added exercise and feels well overall.  Her weight is 276 lb (125.2 kg) today and has had a weight loss of 1 pound over a period of 3 to 4 weeks since her last visit. She has lost 51 lbs since starting treatment with [place].  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 She is currently taking prescription Vit D, but level is not yet at goal. She denies nausea, vomiting or muscle weakness.  At risk for osteopenia and osteoporosis Elizabeth Gibson is at higher risk of osteopenia and osteoporosis due to vitamin D deficiency.   ASSESSMENT AND PLAN:  Vitamin D deficiency - Plan: Vitamin D, Ergocalciferol, (DRISDOL) 1.25 MG (50000 UT) CAPS capsule  At risk for osteoporosis  Class 3 severe obesity with serious comorbidity and body mass index (BMI) of 40.0 to 44.9 in adult, unspecified obesity type ([facility])  PLAN:  Vitamin D Deficiency Elizabeth Gibson was informed that low vitamin D levels contributes to fatigue and are associated with obesity, breast, and colon cancer. Elizabeth Gibson agrees to continue taking prescription Vit D 50,000 IU every week #4 and we will refill for 1 month. She will follow up for routine testing of vitamin D, at least 2-3 times per year. She was informed of the risk of over-replacement of vitamin D and agrees to not increase her dose unless she discusses this with [place] first. Elizabeth Gibson is to get labs done at her primary care physician's office. Elizabeth Gibson agrees to  follow up with our clinic in 2 weeks.  At risk for osteopenia and osteoporosis Elizabeth Gibson was given extended (15 minutes) osteoporosis prevention counseling today. Elizabeth Gibson is at risk for osteopenia and osteoporsis due to her vitamin D deficiency. She was encouraged to take her vitamin D and follow her higher calcium diet and increase strengthening exercise to help strengthen her bones and decrease her risk of osteopenia and osteoporosis.  Obesity Elizabeth Gibson is currently in the action stage of change. As such, her goal is to continue with weight loss efforts She has agreed to follow the Category 3 plan Elizabeth Gibson has been instructed to work up to a goal of 150 minutes of combined cardio and strengthening exercise per week for weight loss and overall health benefits. We discussed the following Behavioral Modification Strategies today: increasing lean protein intake and decreasing simple carbohydrates    Elizabeth Gibson has agreed to follow up with our clinic in 2 weeks. She was informed of the importance of frequent follow up visits to maximize her success with intensive lifestyle modifications for her multiple health conditions.  ALLERGIES: No Known Allergies  MEDICATIONS: Current Outpatient Medications on File Prior to Visit  Medication Sig Dispense Refill  . levothyroxine (SYNTHROID) 50 MCG tablet Take 1 tablet (50 mcg total) by mouth daily before breakfast. 30 tablet 0  . losartan-hydrochlorothiazide (HYZAAR) 50-12.5 MG tablet Take 1 tablet by mouth daily. (Patient taking differently: Take 1 tablet by mouth daily.  1/2 qd) 30 tablet 0  . metFORMIN (GLUCOPHAGE) 500 MG tablet Take 1 tablet (500 mg total) by mouth daily with breakfast. (Patient not taking: Reported on 03/23/2019) 30 tablet 0   No current facility-administered medications on file prior to visit.     PAST MEDICAL HISTORY: Past Medical History:  Diagnosis Date  . Anxiety   . History of heart attack   . Hypertension   . Hypothyroidism   . Kidney  stone   . Vitamin D deficiency     PAST SURGICAL HISTORY: Past Surgical History:  Procedure Laterality Date  . CESAREAN SECTION    . removal of kidney stones      SOCIAL HISTORY: Social History   Tobacco Use  . Smoking status: Never Smoker  . Smokeless tobacco: Never Used  Substance Use Topics  . Alcohol use: No  . Drug use: No    FAMILY HISTORY: Family History  Problem Relation Age of Onset  . Cancer Maternal Aunt        lung  . Breast cancer Maternal Aunt   . Cancer Paternal Aunt        breast  . Cancer Paternal Grandmother        breast  . Eating disorder Mother   . Hypertension Father   . Stroke Father   . Heart disease Father     ROS: Review of Systems  Constitutional: Positive for weight loss.  Gastrointestinal: Negative for nausea and vomiting.  Musculoskeletal:       Negative muscle weakness    PHYSICAL EXAM: Blood pressure 115/63, pulse 67, temperature 98.2 F (36.8 C), temperature source Oral, height 5\' 6"  (1.676 m), weight 276 lb (125.2 kg), last menstrual period 02/24/2019, SpO2 97 %. Body mass index is 44.55 kg/m. Physical Exam Vitals signs reviewed.  Constitutional:      Appearance: Normal appearance. She is obese.  Cardiovascular:     Rate and Rhythm: Normal rate.     Pulses: Normal pulses.  Pulmonary:     Effort: Pulmonary effort is normal.     Breath sounds: Normal breath sounds.  Musculoskeletal: Normal range of motion.  Skin:    General: Skin is warm and dry.  Neurological:     Mental Status: She is alert and oriented to person, place, and time.  Psychiatric:        Mood and Affect: Mood normal.        Behavior: Behavior normal.     RECENT LABS AND TESTS: BMET    Component Value Date/Time   NA 138 12/19/2018 1055   K 4.8 12/19/2018 1055   CL 103 12/19/2018 1055   CO2 21 12/19/2018 1055   GLUCOSE 105 (H) 12/19/2018 1055   GLUCOSE 86 12/27/2013 0845   BUN 17 12/19/2018 1055   CREATININE 0.55 (L) 12/19/2018 1055    CALCIUM 8.9 12/19/2018 1055   GFRNONAA 116 12/19/2018 1055   GFRAA 134 12/19/2018 1055   Lab Results  Component Value Date   HGBA1C 5.6 12/19/2018   HGBA1C 5.7 (H) 07/14/2018   HGBA1C 5.7 12/27/2013   Lab Results  Component Value Date   INSULIN 29.1 (H) 12/19/2018   INSULIN 27.3 (H) 07/14/2018   CBC    Component Value Date/Time   WBC 7.6 12/19/2018 1055   WBC 12.8 (H) 12/27/2013 0845   RBC 4.48 12/19/2018 1055   RBC 4.52 12/27/2013 0845   HGB 10.7 (L) 12/19/2018 1055   HCT 35.5 12/19/2018 1055   PLT 289.0 12/27/2013 0845  MCV 79 12/19/2018 1055   MCH 23.9 (L) 12/19/2018 1055   MCHC 30.1 (L) 12/19/2018 1055   MCHC 33.5 12/27/2013 0845   RDW 16.1 (H) 12/19/2018 1055   LYMPHSABS 1.6 12/19/2018 1055   MONOABS 0.7 12/27/2013 0845   EOSABS 0.2 12/19/2018 1055   BASOSABS 0.1 12/19/2018 1055   Iron/TIBC/Ferritin/ %Sat No results found for: IRON, TIBC, FERRITIN, IRONPCTSAT Lipid Panel     Component Value Date/Time   CHOL 161 12/19/2018 1055   TRIG 65 12/19/2018 1055   HDL 44 12/19/2018 1055   CHOLHDL 3 12/27/2013 0845   VLDL 13.6 12/27/2013 0845   LDLCALC 104 (H) 12/19/2018 1055   Hepatic Function Panel     Component Value Date/Time   PROT 6.8 12/19/2018 1055   ALBUMIN 4.1 12/19/2018 1055   AST 11 12/19/2018 1055   ALT 16 12/19/2018 1055   ALKPHOS 88 12/19/2018 1055   BILITOT <0.2 12/19/2018 1055   BILIDIR 0.1 12/27/2013 0845      Component Value Date/Time   TSH 2.980 12/19/2018 1055   TSH 2.310 07/14/2018 1145   TSH 1.57 12/27/2013 0845      OBESITY BEHAVIORAL INTERVENTION VISIT  Today's visit was # 14   Starting weight: 327 lbs Starting date: 07/14/2018 Today's weight : 276 lbs Today's date: 03/23/2019 Total lbs lost to date: 57    ASK: We discussed the diagnosis of obesity with Elizabeth Gibson today and Elizabeth Gibson agreed to give [place] permission to discuss obesity behavioral modification therapy today.  ASSESS: Elizabeth Gibson has the diagnosis of obesity and her  BMI today is 44.57 Elizabeth Gibson is in the action stage of change   ADVISE: Elizabeth Gibson was educated on the multiple health risks of obesity as well as the benefit of weight loss to improve her health. She was advised of the need for long term treatment and the importance of lifestyle modifications to improve her current health and to decrease her risk of future health problems.  AGREE: Multiple dietary modification options and treatment options were discussed and  Elizabeth Gibson agreed to follow the recommendations documented in the above note.  ARRANGE: Elizabeth Gibson was educated on the importance of frequent visits to treat obesity as outlined per CMS and USPSTF guidelines and agreed to schedule her next follow up appointment today.  I, Trixie Dredge, am acting as transcriptionist for Dennard Nip, MD  I have reviewed the above documentation for accuracy and completeness, and I agree with the above. -Dennard Nip, MD

## 2019-03-28 LAB — BASIC METABOLIC PANEL
BUN: 17 (ref 4–21)
Creatinine: 0.7 (ref 0.5–1.1)
Glucose: 100
Potassium: 4.9 (ref 3.4–5.3)
Sodium: 139 (ref 137–147)

## 2019-03-28 LAB — HEMOGLOBIN A1C: Hemoglobin A1C: 5.8

## 2019-03-28 LAB — TSH: TSH: 2.9 (ref 0.41–5.90)

## 2019-04-06 ENCOUNTER — Ambulatory Visit (INDEPENDENT_AMBULATORY_CARE_PROVIDER_SITE_OTHER): Payer: BC Managed Care – PPO | Admitting: Family Medicine

## 2019-04-06 ENCOUNTER — Other Ambulatory Visit: Payer: Self-pay

## 2019-04-06 ENCOUNTER — Encounter (INDEPENDENT_AMBULATORY_CARE_PROVIDER_SITE_OTHER): Payer: Self-pay

## 2019-04-06 ENCOUNTER — Other Ambulatory Visit: Payer: Self-pay | Admitting: Family Medicine

## 2019-04-06 VITALS — BP 104/72 | HR 63 | Temp 98.1°F | Ht 66.0 in | Wt 274.0 lb

## 2019-04-06 DIAGNOSIS — R7303 Prediabetes: Secondary | ICD-10-CM

## 2019-04-06 DIAGNOSIS — Z1231 Encounter for screening mammogram for malignant neoplasm of breast: Secondary | ICD-10-CM

## 2019-04-06 DIAGNOSIS — Z6841 Body Mass Index (BMI) 40.0 and over, adult: Secondary | ICD-10-CM

## 2019-04-10 NOTE — Progress Notes (Signed)
Office: [phone]  /  Fax: [phone]   HPI:   Chief Complaint: OBESITY Elizabeth Gibson is here to discuss her progress with her obesity treatment plan. She is on the Category 3 plan and is following her eating plan approximately 98 % of the time. She states she is walking and swimming 30 minutes 6 times per week. Elizabeth Gibson has not been journaling at dinner but rather following cat 3 at dinner. She loves structure and she is eating all of the food on the plan. Elizabeth Gibson loves the plan and she adheres very well to the plan. Her weight is 274 lb (124.3 kg) today and has had a weight loss of 2 pounds over a period of 2 weeks since her last visit. She has lost 53 lbs since starting treatment with us.  Pre-Diabetes Elizabeth Gibson has a diagnosis of prediabetes based on her elevated Hgb A1c and was informed this puts her at greater risk of developing diabetes. She has not started metformin that was prescribed. Her PCP is not in favor of metformin. Elizabeth Gibson continues to work on diet and exercise to decrease risk of diabetes. She denies polyphagia. Lab Results  Component Value Date   HGBA1C 5.8 03/28/2019    ASSESSMENT AND PLAN:  Prediabetes  Class 3 severe obesity with serious comorbidity and body mass index (BMI) of 40.0 to 44.9 in adult, unspecified obesity type (HCC)  PLAN:  Pre-Diabetes Elizabeth Gibson will continue to work on weight loss, exercise, and decreasing simple carbohydrates in her diet to help decrease the risk of diabetes.We discussed the benefits of metformin.  Elizabeth Gibson will continue with the meal plan and she will consider starting metformin as prescribed. Elizabeth Gibson agreed to follow up with us as directed to monitor her progress.  Obesity Elizabeth Gibson is currently in the action stage of change. As such, her goal is to continue with weight loss efforts She has agreed to follow the Category 3 plan Elizabeth Gibson will continue walking and swimming 30 minutes, 6 times per week for weight loss and overall health benefits. We  discussed the following Behavioral Modification Strategies today: planning for success and increase H2O intake  Elizabeth Gibson has agreed to follow up with our clinic in 2 to 3 weeks. She was informed of the importance of frequent follow up visits to maximize her success with intensive lifestyle modifications for her multiple health conditions.  ALLERGIES: No Known Allergies  MEDICATIONS: Current Outpatient Medications on File Prior to Visit  Medication Sig Dispense Refill  . levothyroxine (SYNTHROID) 50 MCG tablet Take 1 tablet (50 mcg total) by mouth daily before breakfast. 30 tablet 0  . losartan-hydrochlorothiazide (HYZAAR) 50-12.5 MG tablet Take 1 tablet by mouth daily. (Patient taking differently: Take 1 tablet by mouth daily. 1/2 qd) 30 tablet 0  . metFORMIN (GLUCOPHAGE) 500 MG tablet Take 1 tablet (500 mg total) by mouth daily with breakfast. 30 tablet 0  . Vitamin D, Ergocalciferol, (DRISDOL) 1.25 MG (50000 UT) CAPS capsule Take 1 capsule (50,000 Units total) by mouth every 7 (seven) days. 4 capsule 0   No current facility-administered medications on file prior to visit.     PAST MEDICAL HISTORY: Past Medical History:  Diagnosis Date  . Anxiety   . History of heart attack   . Hypertension   . Hypothyroidism   . Kidney stone   . Vitamin D deficiency     PAST SURGICAL HISTORY: Past Surgical History:  Procedure Laterality Date  . CESAREAN SECTION    . removal of kidney stones  SOCIAL HISTORY: Social History   Tobacco Use  . Smoking status: Never Smoker  . Smokeless tobacco: Never Used  Substance Use Topics  . Alcohol use: No  . Drug use: No    FAMILY HISTORY: Family History  Problem Relation Age of Onset  . Cancer Maternal Aunt        lung  . Breast cancer Maternal Aunt   . Cancer Paternal Aunt        breast  . Cancer Paternal Grandmother        breast  . Eating disorder Mother   . Hypertension Father   . Stroke Father   . Heart disease Father     ROS:  Review of Systems  Constitutional: Positive for weight loss.  Endo/Heme/Allergies:       Negative for polyphagia    PHYSICAL EXAM: Blood pressure 104/72, pulse 63, temperature 98.1 F (36.7 C), temperature source Oral, height 5\' 6"  (1.676 m), weight 274 lb (124.3 kg), last menstrual period 03/14/2019, SpO2 98 %. Body mass index is 44.22 kg/m. Physical Exam Vitals signs reviewed.  Constitutional:      Appearance: Normal appearance. She is well-developed. She is obese.  Cardiovascular:     Rate and Rhythm: Normal rate.  Pulmonary:     Effort: Pulmonary effort is normal.  Musculoskeletal: Normal range of motion.  Skin:    General: Skin is warm and dry.  Neurological:     Mental Status: She is alert and oriented to person, place, and time.  Psychiatric:        Mood and Affect: Mood normal.        Behavior: Behavior normal.     RECENT LABS AND TESTS: BMET    Component Value Date/Time   NA 139 03/28/2019   K 4.9 03/28/2019   CL 103 12/19/2018 1055   CO2 21 12/19/2018 1055   GLUCOSE 105 (H) 12/19/2018 1055   GLUCOSE 86 12/27/2013 0845   BUN 17 03/28/2019   CREATININE 0.7 03/28/2019   CREATININE 0.55 (L) 12/19/2018 1055   CALCIUM 8.9 12/19/2018 1055   GFRNONAA 116 12/19/2018 1055   GFRAA 134 12/19/2018 1055   Lab Results  Component Value Date   HGBA1C 5.8 03/28/2019   HGBA1C 5.6 12/19/2018   HGBA1C 5.7 (H) 07/14/2018   HGBA1C 5.7 12/27/2013   Lab Results  Component Value Date   INSULIN 29.1 (H) 12/19/2018   INSULIN 27.3 (H) 07/14/2018   CBC    Component Value Date/Time   WBC 7.6 12/19/2018 1055   WBC 12.8 (H) 12/27/2013 0845   RBC 4.48 12/19/2018 1055   RBC 4.52 12/27/2013 0845   HGB 10.7 (L) 12/19/2018 1055   HCT 35.5 12/19/2018 1055   PLT 289.0 12/27/2013 0845   MCV 79 12/19/2018 1055   MCH 23.9 (L) 12/19/2018 1055   MCHC 30.1 (L) 12/19/2018 1055   MCHC 33.5 12/27/2013 0845   RDW 16.1 (H) 12/19/2018 1055   LYMPHSABS 1.6 12/19/2018 1055   MONOABS  0.7 12/27/2013 0845   EOSABS 0.2 12/19/2018 1055   BASOSABS 0.1 12/19/2018 1055   Iron/TIBC/Ferritin/ %Sat No results found for: IRON, TIBC, FERRITIN, IRONPCTSAT Lipid Panel     Component Value Date/Time   CHOL 161 12/19/2018 1055   TRIG 65 12/19/2018 1055   HDL 44 12/19/2018 1055   CHOLHDL 3 12/27/2013 0845   VLDL 13.6 12/27/2013 0845   LDLCALC 104 (H) 12/19/2018 1055   Hepatic Function Panel     Component Value Date/Time  PROT 6.8 12/19/2018 1055   ALBUMIN 4.1 12/19/2018 1055   AST 11 12/19/2018 1055   ALT 16 12/19/2018 1055   ALKPHOS 88 12/19/2018 1055   BILITOT <0.2 12/19/2018 1055   BILIDIR 0.1 12/27/2013 0845      Component Value Date/Time   TSH 2.90 03/28/2019   TSH 2.980 12/19/2018 1055   TSH 2.310 07/14/2018 1145   TSH 1.57 12/27/2013 0845     Ref. Range 12/19/2018 10:55  Vitamin D, 25-Hydroxy Latest Ref Range: 30.0 - 100.0 ng/mL 38.9    OBESITY BEHAVIORAL INTERVENTION VISIT  Today's visit was # 15   Starting weight: 327 lbs Starting date: 07/14/2018 Today's weight : 274 lbs Today's date: 04/06/2019 Total lbs lost to date: 53    04/06/2019  Height 5\' 6"  (1.676 m)  Weight 274 lb (124.3 kg)  BMI (Calculated) 44.25  BLOOD PRESSURE - SYSTOLIC 104  BLOOD PRESSURE - DIASTOLIC 72   Body Fat % 49.3 %  Total Body Water (lbs) 101.4 lbs    ASK: We discussed the diagnosis of obesity with today and Annibelle agreed to give Elizabeth Lyons permission to discuss obesity behavioral modification therapy today.  ASSESS: Eudell has the diagnosis of obesity and her BMI today is 44.25 Chalisa is in the action stage of change   ADVISE: Anjuli was educated on the multiple health risks of obesity as well as the benefit of weight loss to improve her health. She was advised of the need for long term treatment and the importance of lifestyle modifications to improve her current health and to decrease her risk of future health problems.  AGREE: Multiple dietary modification  options and treatment options were discussed and  Sheree agreed to follow the recommendations documented in the above note.  ARRANGE: Dynastee was educated on the importance of frequent visits to treat obesity as outlined per CMS and USPSTF guidelines and agreed to schedule her next follow up appointment today.  I, Elizabeth Lyons, am acting as transcriptionist for [place] Crane, FNP-C  I have reviewed the above documentation for accuracy and completeness, and I agree with the above.  - Arliene Rosenow, FNP-C.

## 2019-04-11 ENCOUNTER — Encounter (INDEPENDENT_AMBULATORY_CARE_PROVIDER_SITE_OTHER): Payer: Self-pay | Admitting: Family Medicine

## 2019-04-11 DIAGNOSIS — R7303 Prediabetes: Secondary | ICD-10-CM | POA: Insufficient documentation

## 2019-04-11 DIAGNOSIS — Z6841 Body Mass Index (BMI) 40.0 and over, adult: Secondary | ICD-10-CM | POA: Insufficient documentation

## 2019-04-27 ENCOUNTER — Encounter (INDEPENDENT_AMBULATORY_CARE_PROVIDER_SITE_OTHER): Payer: Self-pay | Admitting: Family Medicine

## 2019-04-27 ENCOUNTER — Other Ambulatory Visit: Payer: Self-pay

## 2019-04-27 ENCOUNTER — Ambulatory Visit (INDEPENDENT_AMBULATORY_CARE_PROVIDER_SITE_OTHER): Payer: BC Managed Care – PPO | Admitting: Family Medicine

## 2019-04-27 VITALS — BP 107/69 | HR 53 | Temp 97.8°F | Ht 66.0 in | Wt 275.0 lb

## 2019-04-27 DIAGNOSIS — Z6841 Body Mass Index (BMI) 40.0 and over, adult: Secondary | ICD-10-CM | POA: Diagnosis not present

## 2019-04-27 DIAGNOSIS — Z9189 Other specified personal risk factors, not elsewhere classified: Secondary | ICD-10-CM | POA: Diagnosis not present

## 2019-04-27 DIAGNOSIS — E559 Vitamin D deficiency, unspecified: Secondary | ICD-10-CM

## 2019-04-27 MED ORDER — VITAMIN D (ERGOCALCIFEROL) 1.25 MG (50000 UNIT) PO CAPS: 50000.0000 [IU] | ORAL_CAPSULE | ORAL | 0 refills | Status: DC

## 2019-04-27 NOTE — Progress Notes (Signed)
Office: [phone]  /  Fax: [phone]   HPI:   Chief Complaint: OBESITY Elizabeth Gibson is here to discuss her progress with her obesity treatment plan. She is on the  follow the Category 3 plan and is following her eating plan approximately 95 % of the time. She states she is exercising by running, walking, and swimming for 30 minutes 6 times per week. [name] continue to do well overall but has done a bit more celebration eating over the last week. She has lost over 50 lbs this year.  Her weight is 275 lb (124.7 kg) today and has had a weight gained of 1 pound over a period of 2 weeks since her last visit. She has lost 52 lbs since starting treatment with Elizabeth Gibson.  Vitamin D deficiency [name] has a diagnosis of vitamin D deficiency. She is currently taking vit D and denies nausea, vomiting or muscle weakness. Vitamin D level not yet at goal.   At risk for cardiovascular disease [name] is at a higher than average risk for cardiovascular disease due to obesity. She currently denies any chest pain.  ASSESSMENT AND PLAN:  Vitamin D deficiency - Plan: Vitamin D, Ergocalciferol, (DRISDOL) 1.25 MG (50000 UT) CAPS capsule  At risk for heart disease  Class 3 severe obesity with serious comorbidity and body mass index (BMI) of 40.0 to 44.9 in adult, unspecified obesity type ([place])  PLAN: Vitamin D Deficiency Hensley was informed that low vitamin D levels contributes to fatigue and are associated with obesity, breast, and colon cancer. She agrees to continue to take prescription Vit D @50 ,000 IU every week #4 with no refills and will follow up for routine testing of vitamin D, at least 2-3 times per year. She was informed of the risk of over-replacement of vitamin D and agrees to not increase her dose unless she discusses this with Elizabeth Gibson first.  Cardiovascular risk counseling [name] was given extended (15 minutes) coronary artery disease prevention counseling today. She is 43 y.o. female and has risk factors for  heart disease including obesity. We discussed intensive lifestyle modifications today with an emphasis on specific weight loss instructions and strategies. Pt was also informed of the importance of increasing exercise and decreasing saturated fats to help prevent heart disease.  Obesity [name] is currently in the action stage of change. As such, her goal is to continue with weight loss efforts She has agreed to follow the Category 3 plan Chane has been instructed to work up to a goal of 150 minutes of combined cardio and strengthening exercise per week and discussed increasing strategy exercises for weight loss and overall health benefits. We discussed the following Behavioral Modification Strategies today: decrease eating out   [name] has agreed to follow up with our clinic in 2-3 weeks. She was informed of the importance of frequent follow up visits to maximize her success with intensive lifestyle modifications for her multiple health conditions.  ALLERGIES: No Known Allergies  MEDICATIONS: Current Outpatient Medications on File Prior to Visit  Medication Sig Dispense Refill  . levothyroxine (SYNTHROID) 50 MCG tablet Take 1 tablet (50 mcg total) by mouth daily before breakfast. 30 tablet 0  . losartan-hydrochlorothiazide (HYZAAR) 50-12.5 MG tablet Take 1 tablet by mouth daily. (Patient taking differently: Take 1 tablet by mouth daily. 1/2 qd) 30 tablet 0  . metFORMIN (GLUCOPHAGE) 500 MG tablet Take 1 tablet (500 mg total) by mouth daily with breakfast. 30 tablet 0  . norethindrone (MICRONOR) 0.35 MG tablet Take 1 tablet  by mouth daily.     No current facility-administered medications on file prior to visit.     PAST MEDICAL HISTORY: Past Medical History:  Diagnosis Date  . Anxiety   . History of heart attack   . Hypertension   . Hypothyroidism   . Kidney stone   . Vitamin D deficiency     PAST SURGICAL HISTORY: Past Surgical History:  Procedure Laterality Date  . CESAREAN  SECTION    . removal of kidney stones      SOCIAL HISTORY: Social History   Tobacco Use  . Smoking status: Never Smoker  . Smokeless tobacco: Never Used  Substance Use Topics  . Alcohol use: No  . Drug use: No    FAMILY HISTORY: Family History  Problem Relation Age of Onset  . Cancer Maternal Aunt        lung  . Breast cancer Maternal Aunt   . Cancer Paternal Aunt        breast  . Cancer Paternal Grandmother        breast  . Eating disorder Mother   . Hypertension Father   . Stroke Father   . Heart disease Father     ROS: Review of Systems  Constitutional: Negative for weight loss.  Cardiovascular: Negative for chest pain.  Gastrointestinal: Negative for nausea and vomiting.  Musculoskeletal:       Negative for muscle weakness    PHYSICAL EXAM: Blood pressure 107/69, pulse (!) 53, temperature 97.8 F (36.6 C), temperature source Oral, height 5\' 6"  (1.676 m), weight 275 lb (124.7 kg), SpO2 100 %. Body mass index is 44.39 kg/m. Physical Exam Vitals signs reviewed.  Constitutional:      Appearance: Normal appearance. She is obese.  HENT:     Head: Normocephalic.     Nose: Nose normal.  Neck:     Musculoskeletal: Normal range of motion.  Cardiovascular:     Rate and Rhythm: Normal rate.  Pulmonary:     Effort: Pulmonary effort is normal.  Musculoskeletal: Normal range of motion.  Skin:    General: Skin is warm and dry.  Neurological:     Mental Status: She is alert and oriented to person, place, and time.  Psychiatric:        Mood and Affect: Mood normal.        Behavior: Behavior normal.     RECENT LABS AND TESTS: BMET    Component Value Date/Time   NA 139 03/28/2019   K 4.9 03/28/2019   CL 103 12/19/2018 1055   CO2 21 12/19/2018 1055   GLUCOSE 105 (H) 12/19/2018 1055   GLUCOSE 86 12/27/2013 0845   BUN 17 03/28/2019   CREATININE 0.7 03/28/2019   CREATININE 0.55 (L) 12/19/2018 1055   CALCIUM 8.9 12/19/2018 1055   GFRNONAA 116 12/19/2018  1055   GFRAA 134 12/19/2018 1055   Lab Results  Component Value Date   HGBA1C 5.8 03/28/2019   HGBA1C 5.6 12/19/2018   HGBA1C 5.7 (H) 07/14/2018   HGBA1C 5.7 12/27/2013   Lab Results  Component Value Date   INSULIN 29.1 (H) 12/19/2018   INSULIN 27.3 (H) 07/14/2018   CBC    Component Value Date/Time   WBC 7.6 12/19/2018 1055   WBC 12.8 (H) 12/27/2013 0845   RBC 4.48 12/19/2018 1055   RBC 4.52 12/27/2013 0845   HGB 10.7 (L) 12/19/2018 1055   HCT 35.5 12/19/2018 1055   PLT 289.0 12/27/2013 0845   MCV 79 12/19/2018 1055  MCH 23.9 (L) 12/19/2018 1055   MCHC 30.1 (L) 12/19/2018 1055   MCHC 33.5 12/27/2013 0845   RDW 16.1 (H) 12/19/2018 1055   LYMPHSABS 1.6 12/19/2018 1055   MONOABS 0.7 12/27/2013 0845   EOSABS 0.2 12/19/2018 1055   BASOSABS 0.1 12/19/2018 1055   Iron/TIBC/Ferritin/ %Sat No results found for: IRON, TIBC, FERRITIN, IRONPCTSAT Lipid Panel     Component Value Date/Time   CHOL 161 12/19/2018 1055   TRIG 65 12/19/2018 1055   HDL 44 12/19/2018 1055   CHOLHDL 3 12/27/2013 0845   VLDL 13.6 12/27/2013 0845   LDLCALC 104 (H) 12/19/2018 1055   Hepatic Function Panel     Component Value Date/Time   PROT 6.8 12/19/2018 1055   ALBUMIN 4.1 12/19/2018 1055   AST 11 12/19/2018 1055   ALT 16 12/19/2018 1055   ALKPHOS 88 12/19/2018 1055   BILITOT <0.2 12/19/2018 1055   BILIDIR 0.1 12/27/2013 0845      Component Value Date/Time   TSH 2.90 03/28/2019   TSH 2.980 12/19/2018 1055   TSH 2.310 07/14/2018 1145   TSH 1.57 12/27/2013 0845     Ref. Range 12/19/2018 10:55  Vitamin D, 25-Hydroxy Latest Ref Range: 30.0 - 100.0 ng/mL 38.9      OBESITY BEHAVIORAL INTERVENTION VISIT  Today's visit was # 16   Starting weight: 327 lbs Starting date: 07/14/18 Today's weight : Weight: 275 lb (124.7 kg)  Today's date: 04/27/2019 Total lbs lost to date: 52 lbs At least 15 minutes were spent on discussing the following behavioral intervention visit.   ASK: We  discussed the diagnosis of obesity with Carollee Leitzasey Carreon today and [name] agreed to give us permission to discuss obesity behavioral modification therapy today.  ASSESS: [name] has the diagnosis of obesity and her BMI today is 44.41 [name] is in the action stage of change   ADVISE: [name] was educated on the multiple health risks of obesity as well as the benefit of weight loss to improve her health. She was advised of the need for long term treatment and the importance of lifestyle modifications to improve her current health and to decrease her risk of future health problems.  AGREE: Multiple dietary modification options and treatment options were discussed and  [name] agreed to follow the recommendations documented in the above note.  ARRANGE: [name] was educated on the importance of frequent visits to treat obesity as outlined per CMS and USPSTF guidelines and agreed to schedule her next follow up appointment today.  I, Jeralene PetersAshleigh Haynes, am acting as transcriptionist for Quillian Quincearen , MD  I have reviewed the above documentation for accuracy and completeness, and I agree with the above. -Quillian Quincearen , MD

## 2019-05-08 ENCOUNTER — Other Ambulatory Visit (INDEPENDENT_AMBULATORY_CARE_PROVIDER_SITE_OTHER): Payer: Self-pay | Admitting: Family Medicine

## 2019-05-08 DIAGNOSIS — R7303 Prediabetes: Secondary | ICD-10-CM

## 2019-05-18 ENCOUNTER — Ambulatory Visit (INDEPENDENT_AMBULATORY_CARE_PROVIDER_SITE_OTHER): Payer: BC Managed Care – PPO | Admitting: Family Medicine

## 2019-05-23 ENCOUNTER — Ambulatory Visit: Payer: BC Managed Care – PPO

## 2019-06-04 ENCOUNTER — Other Ambulatory Visit (INDEPENDENT_AMBULATORY_CARE_PROVIDER_SITE_OTHER): Payer: Self-pay | Admitting: Family Medicine

## 2019-06-04 DIAGNOSIS — R7303 Prediabetes: Secondary | ICD-10-CM

## 2019-06-08 ENCOUNTER — Encounter (INDEPENDENT_AMBULATORY_CARE_PROVIDER_SITE_OTHER): Payer: Self-pay | Admitting: Family Medicine

## 2019-06-08 ENCOUNTER — Other Ambulatory Visit: Payer: Self-pay

## 2019-06-08 ENCOUNTER — Ambulatory Visit (INDEPENDENT_AMBULATORY_CARE_PROVIDER_SITE_OTHER): Payer: BC Managed Care – PPO | Admitting: Family Medicine

## 2019-06-08 VITALS — BP 128/71 | HR 66 | Temp 98.1°F | Ht 66.0 in | Wt 278.0 lb

## 2019-06-08 DIAGNOSIS — E559 Vitamin D deficiency, unspecified: Secondary | ICD-10-CM | POA: Diagnosis not present

## 2019-06-08 DIAGNOSIS — Z9189 Other specified personal risk factors, not elsewhere classified: Secondary | ICD-10-CM

## 2019-06-08 DIAGNOSIS — R7303 Prediabetes: Secondary | ICD-10-CM

## 2019-06-08 DIAGNOSIS — E038 Other specified hypothyroidism: Secondary | ICD-10-CM

## 2019-06-08 DIAGNOSIS — Z6841 Body Mass Index (BMI) 40.0 and over, adult: Secondary | ICD-10-CM

## 2019-06-08 MED ORDER — METFORMIN HCL 500 MG PO TABS: ORAL_TABLET | ORAL | 0 refills | Status: DC

## 2019-06-08 MED ORDER — VITAMIN D (ERGOCALCIFEROL) 1.25 MG (50000 UNIT) PO CAPS: 50000.0000 [IU] | ORAL_CAPSULE | ORAL | 0 refills | Status: DC

## 2019-06-08 MED ORDER — LEVOTHYROXINE SODIUM 50 MCG PO TABS: 50.0000 ug | ORAL_TABLET | Freq: Every day | ORAL | 0 refills | Status: DC

## 2019-06-08 NOTE — Progress Notes (Signed)
Office: [phone]  /  Fax: [phone]   HPI:  Chief Complaint: OBESITY Elizabeth Gibson is here to discuss her progress with her obesity treatment plan. She is on the Category 3 plan and states she is following her eating plan approximately 90% of the time. She states she is walking, using weights, and swimming 30-40 minutes 6 times per week.  Elizabeth Gibson has been working on following her plan over the last 6 weeks and is a bit surprised at her weight gain. She has increased exercise and feels lighter. She notes some constipation.  Today's visit was #17 Starting weight: 327 lbs Starting date: 07/14/2018 Today's weight: 278 lbs Today's date: 06/08/2019 Total lbs lost to date: 49  Total lbs lost since last in-office visit: 0  Prediabetes Elizabeth Gibson has a diagnosis of prediabetes and is working on her diet and tolerating metformin well. Her last A1c was elevated at 5.8 on 03/28/2019. She denies nausea or vomiting but admits to some mild constipation.  Vitamin D deficiency Elizabeth Gibson has a diagnosis of Vitamin D deficiency and is stable on prescription Vitamin D. She is due to have labs rechecked soon.  At risk for cardiovascular disease Elizabeth Gibson is at a higher than average risk for cardiovascular disease due to obesity. She currently denies any chest pain.  Hypothyroidism  Elizabeth Gibson has a diagnosis of hypothyroidism and is stable on levothyroxine. Last TSH was within normal limits at 2.90 on 03/28/2019.  ASSESSMENT AND PLAN:  Vitamin D deficiency - Plan: Vitamin D, Ergocalciferol, (DRISDOL) 1.25 MG (50000 UT) CAPS capsule  Prediabetes - Plan: metFORMIN (GLUCOPHAGE) 500 MG tablet  Other specified hypothyroidism - Plan: levothyroxine (SYNTHROID) 50 MCG tablet  At risk for heart disease  Class 3 severe obesity with serious comorbidity and body mass index (BMI) of 40.0 to 44.9 in adult, unspecified obesity type (HCC)  PLAN:  Pre-Diabetes Elizabeth Gibson will continue to work on weight loss, exercise, and  decreasing simple carbohydrates to help decrease the risk of diabetes. Elizabeth Gibson was given a refill on her metformin 500 mg #30 with 0 refills. She agrees to follow-up with our clinic in 4 weeks at which time she will have labs rechecked. She will continue her diet.  Vitamin D Deficiency Elizabeth Gibson was informed that low Vitamin D levels contributes to fatigue and are associated with obesity, breast, and colon cancer. She agrees to continue to take prescription Vit D @ 50,000 IU every week #4 with 0 refills and will follow-up for routine testing of Vitamin D, at least 2-3 times per year. She was informed of the risk of over-replacement of Vitamin D and agrees to not increase her dose unless she discusses this with us first. Elizabeth Gibson agrees to follow-up with our clinic in 4 weeks.  Cardiovascular risk counseling Elizabeth Gibson was given (~15 minutes) coronary artery disease prevention counseling today. She is 43 y.o. female and has risk factors for heart disease including obesity. We discussed intensive lifestyle modifications today with an emphasis on specific weight loss instructions and strategies.   Hypothyroidism Elizabeth Gibson was informed of the importance of good thyroid control for overall health and that supertheraputic thyroid levels are dangerous and will not improve weight loss results. Elizabeth Gibson was given a refill on her levothyroxine 50 mcg #30 with 0 refills. She agrees to follow-up with our clinic in 4 weeks at which time she will have labs rechecked.  Obesity Elizabeth Gibson is currently in the action stage of change. As such, her goal is to continue with weight loss efforts. She has agreed  to follow the Category 3 plan. Elizabeth Gibson may be retaining water. She will work on making sure she eats all the food on her Category 3 plan. Elizabeth Gibson goal is to work up to 150 minutes of combined cardio and strengthening exercise per week for weight loss and overall health benefits. We discussed the following Behavioral Modification Strategies  today: holiday eating strategies.   Elizabeth Gibson has agreed to follow-up with our clinic in 4 weeks. She was informed of the importance of frequent follow-up visits to maximize her success with intensive lifestyle modifications for her multiple health conditions.  ALLERGIES: No Known Allergies  MEDICATIONS: Current Outpatient Medications on File Prior to Visit  Medication Sig Dispense Refill  . losartan-hydrochlorothiazide (HYZAAR) 50-12.5 MG tablet Take 1 tablet by mouth daily. (Patient taking differently: Take 1 tablet by mouth daily. 1/2 qd) 30 tablet 0  . norethindrone (MICRONOR) 0.35 MG tablet Take 1 tablet by mouth daily.     No current facility-administered medications on file prior to visit.    PAST MEDICAL HISTORY: Past Medical History:  Diagnosis Date  . Anxiety   . History of heart attack   . Hypertension   . Hypothyroidism   . Kidney stone   . Vitamin D deficiency     PAST SURGICAL HISTORY: Past Surgical History:  Procedure Laterality Date  . CESAREAN SECTION    . removal of kidney stones      SOCIAL HISTORY: Social History   Tobacco Use  . Smoking status: Never Smoker  . Smokeless tobacco: Never Used  Substance Use Topics  . Alcohol use: No  . Drug use: No    FAMILY HISTORY: Family History  Problem Relation Age of Onset  . Cancer Maternal Aunt        lung  . Breast cancer Maternal Aunt   . Cancer Paternal Aunt        breast  . Cancer Paternal Grandmother        breast  . Eating disorder Mother   . Hypertension Father   . Stroke Father   . Heart disease Father    ROS: Review of Systems  Gastrointestinal: Positive for constipation (mild). Negative for nausea and vomiting.   PHYSICAL EXAM: Blood pressure 128/71, pulse 66, temperature 98.1 F (36.7 C), temperature source Oral, height 5\' 6"  (1.676 m), weight 278 lb (126.1 kg), SpO2 97 %. Body mass index is 44.87 kg/m. Physical Exam Vitals reviewed.  Constitutional:      Appearance: Normal  appearance. She is obese.  Cardiovascular:     Rate and Rhythm: Normal rate.     Pulses: Normal pulses.  Pulmonary:     Effort: Pulmonary effort is normal.     Breath sounds: Normal breath sounds.  Musculoskeletal:        General: Normal range of motion.  Skin:    General: Skin is warm and dry.  Neurological:     Mental Status: She is alert and oriented to person, place, and time.  Psychiatric:        Behavior: Behavior normal.   RECENT LABS AND TESTS: BMET    Component Value Date/Time   NA 139 03/28/2019 0000   K 4.9 03/28/2019 0000   CL 103 12/19/2018 1055   CO2 21 12/19/2018 1055   GLUCOSE 105 (H) 12/19/2018 1055   GLUCOSE 86 12/27/2013 0845   BUN 17 03/28/2019 0000   CREATININE 0.7 03/28/2019 0000   CREATININE 0.55 (L) 12/19/2018 1055   CALCIUM 8.9 12/19/2018 1055  GFRNONAA 116 12/19/2018 1055   GFRAA 134 12/19/2018 1055   Lab Results  Component Value Date   HGBA1C 5.8 03/28/2019   HGBA1C 5.6 12/19/2018   HGBA1C 5.7 (H) 07/14/2018   HGBA1C 5.7 12/27/2013   Lab Results  Component Value Date   INSULIN 29.1 (H) 12/19/2018   INSULIN 27.3 (H) 07/14/2018   CBC    Component Value Date/Time   WBC 7.6 12/19/2018 1055   WBC 12.8 (H) 12/27/2013 0845   RBC 4.48 12/19/2018 1055   RBC 4.52 12/27/2013 0845   HGB 10.7 (L) 12/19/2018 1055   HCT 35.5 12/19/2018 1055   PLT 289.0 12/27/2013 0845   MCV 79 12/19/2018 1055   MCH 23.9 (L) 12/19/2018 1055   MCHC 30.1 (L) 12/19/2018 1055   MCHC 33.5 12/27/2013 0845   RDW 16.1 (H) 12/19/2018 1055   LYMPHSABS 1.6 12/19/2018 1055   MONOABS 0.7 12/27/2013 0845   EOSABS 0.2 12/19/2018 1055   BASOSABS 0.1 12/19/2018 1055   Iron/TIBC/Ferritin/ %Sat No results found for: IRON, TIBC, FERRITIN, IRONPCTSAT Lipid Panel     Component Value Date/Time   CHOL 161 12/19/2018 1055   TRIG 65 12/19/2018 1055   HDL 44 12/19/2018 1055   CHOLHDL 3 12/27/2013 0845   VLDL 13.6 12/27/2013 0845   LDLCALC 104 (H) 12/19/2018 1055   Hepatic  Function Panel     Component Value Date/Time   PROT 6.8 12/19/2018 1055   ALBUMIN 4.1 12/19/2018 1055   AST 11 12/19/2018 1055   ALT 16 12/19/2018 1055   ALKPHOS 88 12/19/2018 1055   BILITOT <0.2 12/19/2018 1055   BILIDIR 0.1 12/27/2013 0845      Component Value Date/Time   TSH 2.90 03/28/2019 0000   TSH 2.980 12/19/2018 1055   TSH 2.310 07/14/2018 1145   TSH 1.57 12/27/2013 0845     I, Marianna Payment, am acting as Energy manager for Quillian Quince, MD I have reviewed the above documentation for accuracy and completeness, and I agree with the above. -Quillian Quince, MD

## 2019-07-04 ENCOUNTER — Other Ambulatory Visit (INDEPENDENT_AMBULATORY_CARE_PROVIDER_SITE_OTHER): Payer: Self-pay | Admitting: Family Medicine

## 2019-07-04 DIAGNOSIS — R7303 Prediabetes: Secondary | ICD-10-CM

## 2019-07-06 ENCOUNTER — Other Ambulatory Visit: Payer: Self-pay

## 2019-07-06 ENCOUNTER — Ambulatory Visit (INDEPENDENT_AMBULATORY_CARE_PROVIDER_SITE_OTHER): Payer: BC Managed Care – PPO | Admitting: Family Medicine

## 2019-07-06 ENCOUNTER — Encounter (INDEPENDENT_AMBULATORY_CARE_PROVIDER_SITE_OTHER): Payer: Self-pay | Admitting: Family Medicine

## 2019-07-06 VITALS — BP 118/66 | HR 65 | Temp 97.9°F | Ht 66.0 in | Wt 280.0 lb

## 2019-07-06 DIAGNOSIS — E7849 Other hyperlipidemia: Secondary | ICD-10-CM | POA: Diagnosis not present

## 2019-07-06 DIAGNOSIS — R7303 Prediabetes: Secondary | ICD-10-CM | POA: Diagnosis not present

## 2019-07-06 DIAGNOSIS — Z9189 Other specified personal risk factors, not elsewhere classified: Secondary | ICD-10-CM

## 2019-07-06 DIAGNOSIS — Z6841 Body Mass Index (BMI) 40.0 and over, adult: Secondary | ICD-10-CM

## 2019-07-06 DIAGNOSIS — E559 Vitamin D deficiency, unspecified: Secondary | ICD-10-CM | POA: Diagnosis not present

## 2019-07-06 MED ORDER — VITAMIN D (ERGOCALCIFEROL) 1.25 MG (50000 UNIT) PO CAPS: 50000.0000 [IU] | ORAL_CAPSULE | ORAL | 0 refills | Status: DC

## 2019-07-07 LAB — COMPREHENSIVE METABOLIC PANEL
ALT: 10 IU/L (ref 0–32)
AST: 12 IU/L (ref 0–40)
Albumin/Globulin Ratio: 1.4 (ref 1.2–2.2)
Albumin: 4.1 g/dL (ref 3.8–4.8)
Alkaline Phosphatase: 85 IU/L (ref 39–117)
BUN/Creatinine Ratio: 20 (ref 9–23)
BUN: 13 mg/dL (ref 6–24)
Bilirubin Total: 0.3 mg/dL (ref 0.0–1.2)
CO2: 23 mmol/L (ref 20–29)
Calcium: 8.8 mg/dL (ref 8.7–10.2)
Chloride: 103 mmol/L (ref 96–106)
Creatinine, Ser: 0.64 mg/dL (ref 0.57–1.00)
GFR calc Af Amer: 126 mL/min/{1.73_m2} (ref >59)
GFR calc non Af Amer: 110 mL/min/{1.73_m2} (ref >59)
Globulin, Total: 3 g/dL (ref 1.5–4.5)
Glucose: 97 mg/dL (ref 65–99)
Potassium: 4.6 mmol/L (ref 3.5–5.2)
Sodium: 138 mmol/L (ref 134–144)
Total Protein: 7.1 g/dL (ref 6.0–8.5)

## 2019-07-07 LAB — INSULIN, RANDOM: INSULIN: 18.9 u[IU]/mL (ref 2.6–24.9)

## 2019-07-07 LAB — LIPID PANEL WITH LDL/HDL RATIO
Cholesterol, Total: 178 mg/dL (ref 100–199)
HDL: 51 mg/dL (ref >39)
LDL Chol Calc (NIH): 117 mg/dL — ABNORMAL HIGH (ref 0–99)
LDL/HDL Ratio: 2.3 ratio (ref 0.0–3.2)
Triglycerides: 53 mg/dL (ref 0–149)
VLDL Cholesterol Cal: 10 mg/dL (ref 5–40)

## 2019-07-07 LAB — HEMOGLOBIN A1C
Est. average glucose Bld gHb Est-mCnc: 117 mg/dL
Hgb A1c MFr Bld: 5.7 % — ABNORMAL HIGH (ref 4.8–5.6)

## 2019-07-07 LAB — VITAMIN D 25 HYDROXY (VIT D DEFICIENCY, FRACTURES): Vit D, 25-Hydroxy: 32 ng/mL (ref 30.0–100.0)

## 2019-07-10 ENCOUNTER — Other Ambulatory Visit (INDEPENDENT_AMBULATORY_CARE_PROVIDER_SITE_OTHER): Payer: Self-pay | Admitting: Family Medicine

## 2019-07-10 DIAGNOSIS — R7303 Prediabetes: Secondary | ICD-10-CM

## 2019-07-10 DIAGNOSIS — I10 Essential (primary) hypertension: Secondary | ICD-10-CM

## 2019-07-11 ENCOUNTER — Ambulatory Visit
Admission: RE | Admit: 2019-07-11 | Discharge: 2019-07-11 | Disposition: A | Discharge status: Home / Self Care | Payer: BC Managed Care – PPO | Source: Ambulatory Visit | Attending: Family Medicine | Admitting: Family Medicine

## 2019-07-11 ENCOUNTER — Other Ambulatory Visit: Payer: Self-pay

## 2019-07-11 DIAGNOSIS — Z1231 Encounter for screening mammogram for malignant neoplasm of breast: Secondary | ICD-10-CM

## 2019-07-11 MED ORDER — METFORMIN HCL 500 MG PO TABS: ORAL_TABLET | ORAL | 0 refills | Status: DC

## 2019-07-11 MED ORDER — LOSARTAN POTASSIUM-HCTZ 50-12.5 MG PO TABS: 1.0000 | ORAL_TABLET | Freq: Every day | ORAL | 0 refills | Status: DC

## 2019-07-11 NOTE — Progress Notes (Signed)
Chief Complaint:   OBESITY Elizabeth Gibson is here to discuss her progress with her obesity treatment plan along with follow-up of her obesity related diagnoses. Elizabeth Gibson is on the Category 3 Plan and states she is following her eating plan approximately 50% of the time. Elizabeth Gibson states she is doing stretches, walking, and swimming for 40 minutes 6 times per week.  Today's visit was #: 18 Starting weight: 327 lbs Starting date: 07/14/2018 Today's weight: 280 lbs Today's date: 07/06/2019 Total lbs lost to date: 47 Total lbs lost since last in-office visit: 0  Interim History: Elizabeth Gibson did some celebration eating over the holidays, but she did well minimizing weight gain. She has already gotten back on track with her eating plan, and has done well with exercise.  Subjective:   1. Vitamin D deficiency Elizabeth Gibson is on Vit D prescription, and she is due to have labs checked.  2. Other hyperlipidemia Elizabeth Gibson is doing well on her diet prescription. She is not on statin, and she is due to have labs checked.  3. Pre-diabetes Elizabeth Gibson is working on diet and she is tolerating metformin well. She denies hypoglycemia.  4. At risk for hypoglycemia Elizabeth Gibson is at increased risk for hypoglycemia due to changes in diet, diagnosis of diabetes, and/or insulin use. Elizabeth Gibson is not currently taking insulin.   Assessment/Plan:   1. Vitamin D deficiency Low Vitamin D level contributes to fatigue and are associated with obesity, breast, and colon cancer. We will refill prescription Vit D for 1 month and we will check labs today. Elizabeth Gibson will follow-up for routine testing of vitamin D, at least 2-3 times per year to avoid over-replacement. We will continue to monitor.  - Vitamin D (25 hydroxy) - Vitamin D, Ergocalciferol, (DRISDOL) 1.25 MG (50000 UT) CAPS capsule; Take 1 capsule (50,000 Units total) by mouth every 7 (seven) days. Dispense: 4 capsule; Refill: 0.   2. Other hyperlipidemia Cardiovascular risk and specific  lipid/LDL goals reviewed. We discussed several lifestyle modifications today and Elizabeth Gibson will continue to work on diet, exercise and weight loss efforts. We will check labs today. Orders and follow up as documented in patient record.   Counseling Intensive lifestyle modifications are the first line treatment for this issue. . Dietary changes: Increase soluble fiber. Decrease simple carbohydrates. . Exercise changes: Moderate to vigorous-intensity aerobic activity 150 minutes per week if tolerated. . Lipid-lowering medications: see documented in medical record.  - Lipid Panel With LDL/HDL Ratio  3. Pre-diabetes Elizabeth Gibson will continue to work on weight loss, exercise, and decreasing simple carbohydrates to help decrease the risk of diabetes. We will check labs today, and we will refill metformin for 1 month. We will continue to monitor.  - Comprehensive Metabolic Panel (CMET) - HgB A1c - Insulin, random  4. At risk for hypoglycemia Elizabeth Gibson was given approximately 15 minutes of counseling today regarding prevention of hypoglycemia. She was advised of symptoms of hypoglycemia. Elizabeth Gibson was instructed to eat regular meals.   5. Class 3 severe obesity with serious comorbidity and body mass index (BMI) of 45.0 to 49.9 in adult, unspecified obesity type [facility]) Elizabeth Gibson is currently in the action stage of change. As such, her goal is to continue with weight loss efforts. She has agreed to on the Category 3 Plan.   We discussed the following exercise goals today: Elizabeth Gibson is to continue her current exercise regimen as is.  We discussed the following behavioral modification strategies today: increasing lean protein intake and meal planning and cooking  strategies.  Elizabeth Gibson has agreed to follow-up with our clinic in 2 to 3 weeks. She was informed of the importance of frequent follow-up visits to maximize her success with intensive lifestyle modifications for her multiple health conditions.   Elizabeth Gibson was informed we  would discuss her lab results at her next visit unless there is a critical issue that needs to be addressed sooner. Elizabeth Gibson agreed to keep her next visit at the agreed upon time to discuss these results.  Objective:   Blood pressure 118/66, pulse 65, temperature 97.9 F (36.6 C), temperature source Oral, height 5\' 6"  (1.676 m), weight 280 lb (127 kg), SpO2 96 %. Body mass index is 45.19 kg/m.  General: Cooperative, alert, well developed, in no acute distress. HEENT: Conjunctivae and lids unremarkable. Neck: No thyromegaly.  Cardiovascular: Regular rhythm.  Lungs: Normal work of breathing. Extremities: No edema.  Neurologic: No focal deficits.   Lab Results  Component Value Date   CREATININE 0.64 07/06/2019   BUN 13 07/06/2019   NA 138 07/06/2019   K 4.6 07/06/2019   CL 103 07/06/2019   CO2 23 07/06/2019   Lab Results  Component Value Date   ALT 10 07/06/2019   AST 12 07/06/2019   ALKPHOS 85 07/06/2019   BILITOT 0.3 07/06/2019   Lab Results  Component Value Date   HGBA1C 5.7 (H) 07/06/2019   HGBA1C 5.8 03/28/2019   HGBA1C 5.6 12/19/2018   HGBA1C 5.7 (H) 07/14/2018   HGBA1C 5.7 12/27/2013   Lab Results  Component Value Date   INSULIN 18.9 07/06/2019   INSULIN 29.1 (H) 12/19/2018   INSULIN 27.3 (H) 07/14/2018   Lab Results  Component Value Date   TSH 2.90 03/28/2019   Lab Results  Component Value Date   CHOL 178 07/06/2019   HDL 51 07/06/2019   LDLCALC 117 (H) 07/06/2019   TRIG 53 07/06/2019   CHOLHDL 3 12/27/2013   Lab Results  Component Value Date   WBC 7.6 12/19/2018   HGB 10.7 (L) 12/19/2018   HCT 35.5 12/19/2018   MCV 79 12/19/2018   PLT 289.0 12/27/2013   No results found for: IRON, TIBC, FERRITIN  Attestation Statements:   Reviewed by clinician on day of visit: allergies, medications, problem list, medical history, surgical history, family history, social history, and previous encounter notes.   I, 02/27/2014, am acting as  transcriptionist for Burt Knack, MD.  I have reviewed the above documentation for accuracy and completeness, and I agree with the above. -  Quillian Quince, MD

## 2019-07-26 ENCOUNTER — Other Ambulatory Visit: Payer: Self-pay

## 2019-07-26 ENCOUNTER — Encounter (INDEPENDENT_AMBULATORY_CARE_PROVIDER_SITE_OTHER): Payer: Self-pay | Admitting: Family Medicine

## 2019-07-26 ENCOUNTER — Ambulatory Visit (INDEPENDENT_AMBULATORY_CARE_PROVIDER_SITE_OTHER): Payer: BC Managed Care – PPO | Admitting: Family Medicine

## 2019-07-26 VITALS — BP 123/78 | HR 64 | Temp 97.7°F | Ht 66.0 in | Wt 278.0 lb

## 2019-07-26 DIAGNOSIS — E559 Vitamin D deficiency, unspecified: Secondary | ICD-10-CM | POA: Diagnosis not present

## 2019-07-26 DIAGNOSIS — R7303 Prediabetes: Secondary | ICD-10-CM | POA: Diagnosis not present

## 2019-07-26 DIAGNOSIS — Z6841 Body Mass Index (BMI) 40.0 and over, adult: Secondary | ICD-10-CM

## 2019-07-26 DIAGNOSIS — Z9189 Other specified personal risk factors, not elsewhere classified: Secondary | ICD-10-CM | POA: Diagnosis not present

## 2019-07-26 MED ORDER — VITAMIN D (ERGOCALCIFEROL) 1.25 MG (50000 UNIT) PO CAPS: 50000.0000 [IU] | ORAL_CAPSULE | ORAL | 0 refills | Status: DC

## 2019-07-27 NOTE — Progress Notes (Signed)
Chief Complaint:   OBESITY Elizabeth Gibson is here to discuss her progress with her obesity treatment plan along with follow-up of her obesity related diagnoses. Elizabeth Gibson is on the Category 3 Plan and states she is following her eating plan approximately 99% of the time. Elizabeth Gibson states she is swimming, doing strength training, and cardio for 30-40 minutes 6 times per week.  Today's visit was #: 22 Starting weight: 327 lbs Starting date: 07/14/2018 Today's weight: 278 lbs Today's date: 07/26/2019 Total lbs lost to date: 49 Total lbs lost since last in-office visit: 2  Interim History: Elizabeth Gibson continues to do very well with her weight loss, but she is frustrated as she feels she should have lost more even though she has done exceptionally well with her weight loss efforts.  Subjective:   1. Vitamin D deficiency Elizabeth Gibson is stable on Vit D, but level is not yet at goal. I discussed labs with the patient today.  2. Pre-diabetes Elizabeth Gibson's last A1c, glucose, and insulin were discussed with the patient today. She is stable on metformin, and she is doing well with diet and weight loss.   3. At risk for constipation Elizabeth Gibson is at increased risk for constipation due to protein intake, inadequate water intake, changes in diet, and/or use of medications such as GLP1 agonists. Elizabeth Gibson denies hard, infrequent stools currently.   Assessment/Plan:   1. Vitamin D deficiency Low Vitamin D level contributes to fatigue and are associated with obesity, breast, and colon cancer. We will refill prescription Vit D for 1 month. Elizabeth Gibson will follow-up for routine testing of Vitamin D, at least 2-3 times per year to avoid over-replacement. We will continue to monitor.  - Vitamin D, Ergocalciferol, (DRISDOL) 1.25 MG (50000 UNIT) CAPS capsule; Take 1 capsule (50,000 Units total) by mouth every 7 (seven) days.  Dispense: 4 capsule; Refill: 0  2. Pre-diabetes Elizabeth Gibson will continue to work on weight loss, diet, exercise, and decreasing  simple carbohydrates to help decrease the risk of diabetes.   3. At risk for constipation Elizabeth Gibson was given approximately 15 minutes of counseling today regarding prevention of constipation. She was encouraged to increase water and fiber intake.   4. Class 3 severe obesity with serious comorbidity and body mass index (BMI) of 45.0 to 49.9 in adult, unspecified obesity type [facility]) Elizabeth Gibson is currently in the action stage of change. As such, her goal is to continue with weight loss efforts. She has agreed to the Category 3 Plan.   Elizabeth Gibson was reassured she is doing well and I congratulated her.  Exercise goals: As is  Behavioral modification strategies: increasing lean protein intake and meal planning and cooking strategies.  Elizabeth Gibson has agreed to follow-up with our clinic in 3 weeks. She was informed of the importance of frequent follow-up visits to maximize her success with intensive lifestyle modifications for her multiple health conditions.   Objective:   Blood pressure 123/78, pulse 64, temperature 97.7 F (36.5 C), temperature source Oral, height 5\' 6"  (1.676 m), weight 278 lb (126.1 kg), SpO2 100 %. Body mass index is 44.87 kg/m.  General: Cooperative, alert, well developed, in no acute distress. HEENT: Conjunctivae and lids unremarkable. Cardiovascular: Regular rhythm.  Lungs: Normal work of breathing. Neurologic: No focal deficits.   Lab Results  Component Value Date   CREATININE 0.64 07/06/2019   BUN 13 07/06/2019   NA 138 07/06/2019   K 4.6 07/06/2019   CL 103 07/06/2019   CO2 23 07/06/2019   Lab Results  Component Value Date   ALT 10 07/06/2019   AST 12 07/06/2019   ALKPHOS 85 07/06/2019   BILITOT 0.3 07/06/2019   Lab Results  Component Value Date   HGBA1C 5.7 (H) 07/06/2019   HGBA1C 5.8 03/28/2019   HGBA1C 5.6 12/19/2018   HGBA1C 5.7 (H) 07/14/2018   HGBA1C 5.7 12/27/2013   Lab Results  Component Value Date   INSULIN 18.9 07/06/2019   INSULIN 29.1 (H)  12/19/2018   INSULIN 27.3 (H) 07/14/2018   Lab Results  Component Value Date   TSH 2.90 03/28/2019   Lab Results  Component Value Date   CHOL 178 07/06/2019   HDL 51 07/06/2019   LDLCALC 117 (H) 07/06/2019   TRIG 53 07/06/2019   CHOLHDL 3 12/27/2013   Lab Results  Component Value Date   WBC 7.6 12/19/2018   HGB 10.7 (L) 12/19/2018   HCT 35.5 12/19/2018   MCV 79 12/19/2018   PLT 289.0 12/27/2013   No results found for: IRON, TIBC, FERRITIN  Attestation Statements:   Reviewed by clinician on day of visit: allergies, medications, problem list, medical history, surgical history, family history, social history, and previous encounter notes.   I, Burt Knack, am acting as transcriptionist for Quillian Quince, MD.  I have reviewed the above documentation for accuracy and completeness, and I agree with the above. -  Quillian Quince, MD

## 2019-08-06 ENCOUNTER — Other Ambulatory Visit (INDEPENDENT_AMBULATORY_CARE_PROVIDER_SITE_OTHER): Payer: Self-pay | Admitting: Family Medicine

## 2019-08-06 DIAGNOSIS — R7303 Prediabetes: Secondary | ICD-10-CM

## 2019-08-12 ENCOUNTER — Ambulatory Visit: Payer: BC Managed Care – PPO

## 2019-08-14 ENCOUNTER — Ambulatory Visit (INDEPENDENT_AMBULATORY_CARE_PROVIDER_SITE_OTHER): Payer: BC Managed Care – PPO | Admitting: Family Medicine

## 2019-08-24 ENCOUNTER — Encounter (INDEPENDENT_AMBULATORY_CARE_PROVIDER_SITE_OTHER): Payer: Self-pay | Admitting: Family Medicine

## 2019-08-24 ENCOUNTER — Ambulatory Visit (INDEPENDENT_AMBULATORY_CARE_PROVIDER_SITE_OTHER): Payer: BC Managed Care – PPO | Admitting: Family Medicine

## 2019-08-24 ENCOUNTER — Other Ambulatory Visit: Payer: Self-pay

## 2019-08-24 VITALS — BP 128/65 | HR 60 | Temp 98.0°F | Ht 66.0 in | Wt 284.0 lb

## 2019-08-24 DIAGNOSIS — R7303 Prediabetes: Secondary | ICD-10-CM | POA: Diagnosis not present

## 2019-08-24 DIAGNOSIS — Z6841 Body Mass Index (BMI) 40.0 and over, adult: Secondary | ICD-10-CM

## 2019-08-24 DIAGNOSIS — E559 Vitamin D deficiency, unspecified: Secondary | ICD-10-CM | POA: Diagnosis not present

## 2019-08-24 MED ORDER — VITAMIN D (ERGOCALCIFEROL) 1.25 MG (50000 UNIT) PO CAPS: 50000.0000 [IU] | ORAL_CAPSULE | ORAL | 0 refills | Status: DC

## 2019-08-24 MED ORDER — METFORMIN HCL 500 MG PO TABS: 500.0000 mg | ORAL_TABLET | Freq: Two times a day (BID) | ORAL | 0 refills | Status: DC

## 2019-08-24 NOTE — Progress Notes (Signed)
Chief Complaint:   OBESITY Elizabeth Gibson is here to discuss her progress with her obesity treatment plan along with follow-up of her obesity related diagnoses. Elizabeth Gibson is on the Category 3 Plan and states she is following her eating plan approximately 60% of the time. Elizabeth Gibson states she is swimming, walking and doing aerobics for 30 minutes 6 times per week.  Today's visit was #: 20 Starting weight: 327 lbs Starting date: 07/14/2018 Today's weight: 284 lbs Today's date: 08/24/2019 Total lbs lost to date: 43 Total lbs lost since last in-office visit: 0  Interim History: Elizabeth Gibson notes she has off the plan quite a bit over the past 3 weeks. She is not meal planning as much as she had been. She reports snacking too much after school and before dinner. She is a school principal.  Subjective:   Vitamin D deficiency Elizabeth Gibson's las vitamin D level was 32.0 on 07/06/19 and was not at goal. She is on prescription vit D.   Prediabetes Elizabeth Gibson has a diagnosis of prediabetes based on her elevated Hgb A1c and she is on metformin once daily. She continues to work on diet and exercise to decrease her risk of diabetes. She admits to polyphagia in the afternoon.  Lab Results  Component Value Date   HGBA1C 5.7 (H) 07/06/2019   Lab Results  Component Value Date   INSULIN 18.9 07/06/2019   INSULIN 29.1 (H) 12/19/2018   INSULIN 27.3 (H) 07/14/2018   Assessment/Plan:   Vitamin D deficiency Low Vitamin D level contributes to fatigue and are associated with obesity, breast, and colon cancer. [name] agrees to continue to take prescription Vitamin D @50 ,000 IU every week #4 with no refills and she will follow-up for routine testing of Vitamin D, at least 2-3 times per year to avoid over-replacement.  Prediabetes  Elizabeth Gibson will continue to work on weight loss, exercise, and decreasing simple carbohydrates to help decrease the risk of diabetes. Elizabeth Gibson agrees to increase the dose of metformin to 500 mg two times daily  with meals #60 with no refills. She will take this at breakfast and lunch.  Class 3 severe obesity with serious comorbidity and body mass index (BMI) of 45.0 to 49.9 in adult, unspecified obesity type [facility]) Elizabeth Gibson is currently in the action stage of change. As such, her goal is to continue with weight loss efforts. She has agreed to the Category 3 Plan.   Exercise goals: Elizabeth Gibson will continue her current exercise regimen.  Behavioral modification strategies: meal planning and cooking strategies, better snacking choices and planning for success. Elizabeth Gibson will move her food from lunch, to allow for snacking after school. Elizabeth Gibson has agreed to follow-up with our clinic in 3 weeks. She was informed of the importance of frequent follow-up visits to maximize her success with intensive lifestyle modifications for her multiple health conditions.   Objective:   Blood pressure 128/65, pulse 60, temperature 98 F (36.7 C), temperature source Oral, height 5\' 6"  (1.676 m), weight 284 lb (128.8 kg), SpO2 98 %. Body mass index is 45.84 kg/m.  General: Cooperative, alert, well developed, in no acute distress. HEENT: Conjunctivae and lids unremarkable. Cardiovascular: Regular rhythm.  Lungs: Normal work of breathing. Neurologic: No focal deficits.   Lab Results  Component Value Date   CREATININE 0.64 07/06/2019   BUN 13 07/06/2019   NA 138 07/06/2019   K 4.6 07/06/2019   CL 103 07/06/2019   CO2 23 07/06/2019   Lab Results  Component Value Date  ALT 10 07/06/2019   AST 12 07/06/2019   ALKPHOS 85 07/06/2019   BILITOT 0.3 07/06/2019   Lab Results  Component Value Date   HGBA1C 5.7 (H) 07/06/2019   HGBA1C 5.8 03/28/2019   HGBA1C 5.6 12/19/2018   HGBA1C 5.7 (H) 07/14/2018   HGBA1C 5.7 12/27/2013   Lab Results  Component Value Date   INSULIN 18.9 07/06/2019   INSULIN 29.1 (H) 12/19/2018   INSULIN 27.3 (H) 07/14/2018   Lab Results  Component Value Date   TSH 2.90 03/28/2019   Lab Results    Component Value Date   CHOL 178 07/06/2019   HDL 51 07/06/2019   LDLCALC 117 (H) 07/06/2019   TRIG 53 07/06/2019   CHOLHDL 3 12/27/2013   Lab Results  Component Value Date   WBC 7.6 12/19/2018   HGB 10.7 (L) 12/19/2018   HCT 35.5 12/19/2018   MCV 79 12/19/2018   PLT 289.0 12/27/2013   No results found for: IRON, TIBC, FERRITIN   Ref. Range 07/06/2019 10:01  Vitamin D, 25-Hydroxy Latest Ref Range: 30.0 - 100.0 ng/mL 32.0    Attestation Statements:   Reviewed by clinician on day of visit: allergies, medications, problem list, medical history, surgical history, family history, social history, and previous encounter notes.  Corey Skains, am acting as Location manager for [employer], FNP-C.  I have reviewed the above documentation for accuracy and completeness, and I agree with the above. -  Estrella Alcaraz [employer], FNP-C

## 2019-09-20 ENCOUNTER — Ambulatory Visit (INDEPENDENT_AMBULATORY_CARE_PROVIDER_SITE_OTHER): Payer: BC Managed Care – PPO | Admitting: Family Medicine

## 2019-09-20 ENCOUNTER — Encounter (INDEPENDENT_AMBULATORY_CARE_PROVIDER_SITE_OTHER): Payer: Self-pay | Admitting: Family Medicine

## 2019-09-20 ENCOUNTER — Other Ambulatory Visit: Payer: Self-pay

## 2019-09-20 VITALS — BP 112/76 | HR 91 | Temp 97.9°F | Ht 66.0 in | Wt 283.0 lb

## 2019-09-20 DIAGNOSIS — E559 Vitamin D deficiency, unspecified: Secondary | ICD-10-CM | POA: Diagnosis not present

## 2019-09-20 DIAGNOSIS — R7303 Prediabetes: Secondary | ICD-10-CM | POA: Diagnosis not present

## 2019-09-20 DIAGNOSIS — Z9189 Other specified personal risk factors, not elsewhere classified: Secondary | ICD-10-CM | POA: Diagnosis not present

## 2019-09-20 DIAGNOSIS — Z6841 Body Mass Index (BMI) 40.0 and over, adult: Secondary | ICD-10-CM

## 2019-09-21 MED ORDER — METFORMIN HCL 500 MG PO TABS: 500.0000 mg | ORAL_TABLET | Freq: Two times a day (BID) | ORAL | 0 refills | Status: DC

## 2019-09-21 MED ORDER — VITAMIN D (ERGOCALCIFEROL) 1.25 MG (50000 UNIT) PO CAPS: 50000.0000 [IU] | ORAL_CAPSULE | ORAL | 0 refills | Status: DC

## 2019-09-21 NOTE — Progress Notes (Signed)
Chief Complaint:   OBESITY Elizabeth Gibson is here to discuss her progress with her obesity treatment plan along with follow-up of her obesity related diagnoses. Elizabeth Gibson is on the Category 3 Plan and states she is following her eating plan approximately 50% of the time. Elizabeth Gibson states she is walking, swimming, and doing aerobics for 30-40 minutes 6 times per week.  Today's visit was #: 21 Starting weight: 327 lbs Starting date: 07/14/2018 Today's weight: 283 lbs Today's date: 09/20/2019 Total lbs lost to date: 44 Total lbs lost since last in-office visit: 1  Interim History: Elizabeth Gibson has been eating foods that are not on the meal plan. But she states she is not as horrible as she have been in the past. She is strictly following breakfast on the plan, and moderately following lunch. She really feels that dinner she deviates the most. She has been able to maintain regular exercise 6 days per week since July 2020.  Subjective:   1. Pre-diabetes Elizabeth Gibson's last A1c was 5.7 on 07/06/2019. She is currently on metformin 500 mg BID, and she is tolerating it well. She denies GI upset.   2. Vitamin D deficiency Elizabeth Gibson's last Vit D level was 32 on 07/06/2019. Her Vit D goal is >50. She is currently on prescription Vit D supplementation.  3. At risk for heart disease Elizabeth Gibson is at a higher than average risk for cardiovascular disease due to obesity, hypertension, and pre-diabetes. Reviewed: no chest pain on exertion, no dyspnea on exertion, and no swelling of ankles.  Assessment/Plan:   1. Pre-diabetes Elizabeth Gibson will continue her Category 3 meal plan, and will continue to work on weight loss, exercise, and decreasing simple carbohydrates to help decrease the risk of diabetes. We will refill metformin for 1 month. We will recheck labs in 1 month.  - metFORMIN (GLUCOPHAGE) 500 MG tablet; Take 1 tablet (500 mg total) by mouth 2 (two) times daily with a meal.  Dispense: 60 tablet; Refill: 0  2. Vitamin D deficiency Low  Vitamin D level contributes to fatigue and are associated with obesity, breast, and colon cancer. We will refill prescription Vitamin D for 1 month. Elizabeth Gibson will follow-up for routine testing of Vitamin D, at least 2-3 times per year to avoid over-replacement. We will recheck labs in 1 month.  - Vitamin D, Ergocalciferol, (DRISDOL) 1.25 MG (50000 UNIT) CAPS capsule; Take 1 capsule (50,000 Units total) by mouth every 7 (seven) days.  Dispense: 4 capsule; Refill: 0  3. At risk for heart disease Elizabeth Gibson was given approximately 15 minutes of coronary artery disease prevention counseling today. She is 44 y.o. female and has risk factors for heart disease including obesity, hypertension, and pre-diabetes. We discussed intensive lifestyle modifications today with an emphasis on specific weight loss instructions and strategies.   Repetitive spaced learning was employed today to elicit superior memory formation and behavioral change.  4. Class 3 severe obesity with serious comorbidity and body mass index (BMI) of 45.0 to 49.9 in adult, unspecified obesity type [facility]) Elizabeth Gibson is currently in the action stage of change. As such, her goal is to continue with weight loss efforts. She has agreed to the Category 3 Plan.   Exercise goals: As is.  Behavioral modification strategies: no skipping meals.  Elizabeth Gibson has agreed to follow-up with our clinic in 3 weeks. She was informed of the importance of frequent follow-up visits to maximize her success with intensive lifestyle modifications for her multiple health conditions.   Objective:   Blood  pressure 112/76, pulse 91, temperature 97.9 F (36.6 C), temperature source Oral, height 5\' 6"  (1.676 m), weight 283 lb (128.4 kg), SpO2 98 %. Body mass index is 45.68 kg/m.  General: Cooperative, alert, well developed, in no acute distress. HEENT: Conjunctivae and lids unremarkable. Cardiovascular: Regular rhythm.  Lungs: Normal work of breathing. Neurologic: No focal  deficits.   Lab Results  Component Value Date   CREATININE 0.64 07/06/2019   BUN 13 07/06/2019   NA 138 07/06/2019   K 4.6 07/06/2019   CL 103 07/06/2019   CO2 23 07/06/2019   Lab Results  Component Value Date   ALT 10 07/06/2019   AST 12 07/06/2019   ALKPHOS 85 07/06/2019   BILITOT 0.3 07/06/2019   Lab Results  Component Value Date   HGBA1C 5.7 (H) 07/06/2019   HGBA1C 5.8 03/28/2019   HGBA1C 5.6 12/19/2018   HGBA1C 5.7 (H) 07/14/2018   HGBA1C 5.7 12/27/2013   Lab Results  Component Value Date   INSULIN 18.9 07/06/2019   INSULIN 29.1 (H) 12/19/2018   INSULIN 27.3 (H) 07/14/2018   Lab Results  Component Value Date   TSH 2.90 03/28/2019   Lab Results  Component Value Date   CHOL 178 07/06/2019   HDL 51 07/06/2019   LDLCALC 117 (H) 07/06/2019   TRIG 53 07/06/2019   CHOLHDL 3 12/27/2013   Lab Results  Component Value Date   WBC 7.6 12/19/2018   HGB 10.7 (L) 12/19/2018   HCT 35.5 12/19/2018   MCV 79 12/19/2018   PLT 289.0 12/27/2013   No results found for: IRON, TIBC, FERRITIN  Attestation Statements:   Reviewed by clinician on day of visit: allergies, medications, problem list, medical history, surgical history, family history, social history, and previous encounter notes.   I, Trixie Dredge, am acting as transcriptionist for Dennard Nip, MD.  I have reviewed the above documentation for accuracy and completeness, and I agree with the above. -  Dennard Nip, MD

## 2019-10-16 ENCOUNTER — Other Ambulatory Visit: Payer: Self-pay

## 2019-10-16 ENCOUNTER — Ambulatory Visit (INDEPENDENT_AMBULATORY_CARE_PROVIDER_SITE_OTHER): Payer: BC Managed Care – PPO | Admitting: Family Medicine

## 2019-10-16 ENCOUNTER — Encounter (INDEPENDENT_AMBULATORY_CARE_PROVIDER_SITE_OTHER): Payer: Self-pay | Admitting: Family Medicine

## 2019-10-16 VITALS — BP 130/74 | HR 76 | Temp 97.9°F | Ht 66.0 in | Wt 291.0 lb

## 2019-10-16 DIAGNOSIS — E559 Vitamin D deficiency, unspecified: Secondary | ICD-10-CM | POA: Diagnosis not present

## 2019-10-16 DIAGNOSIS — Z6841 Body Mass Index (BMI) 40.0 and over, adult: Secondary | ICD-10-CM | POA: Diagnosis not present

## 2019-10-16 DIAGNOSIS — R7303 Prediabetes: Secondary | ICD-10-CM | POA: Diagnosis not present

## 2019-10-16 MED ORDER — VITAMIN D (ERGOCALCIFEROL) 1.25 MG (50000 UNIT) PO CAPS: 50000.0000 [IU] | ORAL_CAPSULE | ORAL | 0 refills | Status: DC

## 2019-10-17 NOTE — Progress Notes (Signed)
Chief Complaint:   OBESITY Elizabeth Gibson is here to discuss her progress with her obesity treatment plan along with follow-up of her obesity related diagnoses. Elizabeth Gibson is on the Category 3 Plan and states she is following her eating plan approximately 80% of the time. Elizabeth Gibson states she is walking, swimming, and aerobics for 30 minutes 6 times per week.  Today's visit was #: 22 Starting weight: 327 lbs Starting date: 07/14/2018 Today's weight: 291 lbs Today's date: 10/16/2019 Total lbs lost to date: 36 Total lbs lost since last in-office visit: 0  Interim History: Elizabeth Gibson has been off track with her plan since her last visit with her family health challenges, and increased traveling which makes meal planning more difficult. She states she is ready to get back on track.  Subjective:   1. Vitamin D deficiency Elizabeth Gibson is stable on Vit D, and she denies nausea, vomiting, or muscle weakness. She request a refill today.  2. Pre-diabetes Elizabeth Gibson is stable on metformin, and she is struggling more with diet but still exercising. She is ready to get back on track.  Assessment/Plan:   1. Vitamin D deficiency Low Vitamin D level contributes to fatigue and are associated with obesity, breast, and colon cancer. We will refill prescription Vitamin D for 1 month. Palmina will follow-up for routine testing of Vitamin D, at least 2-3 times per year to avoid over-replacement. We will recheck labs in 1-2 months.  - Vitamin D, Ergocalciferol, (DRISDOL) 1.25 MG (50000 UNIT) CAPS capsule; Take 1 capsule (50,000 Units total) by mouth every 7 (seven) days.  Dispense: 4 capsule; Refill: 0  2. Pre-diabetes Elizabeth Gibson will continue metformin, and will continue to work on weight loss, diet, exercise, and decreasing simple carbohydrates to help decrease the risk of diabetes. We will recheck labs in 1-2 months.  3. Class 3 severe obesity with serious comorbidity and body mass index (BMI) of 45.0 to 49.9 in adult, unspecified obesity  type [facility]) Elizabeth Gibson is currently in the action stage of change. As such, her goal is to continue with weight loss efforts. She has agreed to the Category 3 Plan and keeping a food journal and adhering to recommended goals of 400-600 calories and 40+ grams of protein at lunch or supper daily.   Exercise goals: As is.  Behavioral modification strategies: increasing lean protein intake and meal planning and cooking strategies.  Elizabeth Gibson has agreed to follow-up with our clinic in 3 weeks. She was informed of the importance of frequent follow-up visits to maximize her success with intensive lifestyle modifications for her multiple health conditions.   Objective:   Blood pressure 130/74, pulse 76, temperature 97.9 F (36.6 C), temperature source Oral, height 5\' 6"  (1.676 m), weight 291 lb (132 kg), SpO2 98 %. Body mass index is 46.97 kg/m.  General: Cooperative, alert, well developed, in no acute distress. HEENT: Conjunctivae and lids unremarkable. Cardiovascular: Regular rhythm.  Lungs: Normal work of breathing. Neurologic: No focal deficits.   Lab Results  Component Value Date   CREATININE 0.64 07/06/2019   BUN 13 07/06/2019   NA 138 07/06/2019   K 4.6 07/06/2019   CL 103 07/06/2019   CO2 23 07/06/2019   Lab Results  Component Value Date   ALT 10 07/06/2019   AST 12 07/06/2019   ALKPHOS 85 07/06/2019   BILITOT 0.3 07/06/2019   Lab Results  Component Value Date   HGBA1C 5.7 (H) 07/06/2019   HGBA1C 5.8 03/28/2019   HGBA1C 5.6 12/19/2018  HGBA1C 5.7 (H) 07/14/2018   HGBA1C 5.7 12/27/2013   Lab Results  Component Value Date   INSULIN 18.9 07/06/2019   INSULIN 29.1 (H) 12/19/2018   INSULIN 27.3 (H) 07/14/2018   Lab Results  Component Value Date   TSH 2.90 03/28/2019   Lab Results  Component Value Date   CHOL 178 07/06/2019   HDL 51 07/06/2019   LDLCALC 117 (H) 07/06/2019   TRIG 53 07/06/2019   CHOLHDL 3 12/27/2013   Lab Results  Component Value Date   WBC 7.6  12/19/2018   HGB 10.7 (L) 12/19/2018   HCT 35.5 12/19/2018   MCV 79 12/19/2018   PLT 289.0 12/27/2013   No results found for: IRON, TIBC, FERRITIN  Attestation Statements:   Reviewed by clinician on day of visit: allergies, medications, problem list, medical history, surgical history, family history, social history, and previous encounter notes.   I, Trixie Dredge, am acting as transcriptionist for Dennard Nip, MD.  I have reviewed the above documentation for accuracy and completeness, and I agree with the above. -  Dennard Nip, MD

## 2019-10-19 ENCOUNTER — Other Ambulatory Visit (INDEPENDENT_AMBULATORY_CARE_PROVIDER_SITE_OTHER): Payer: Self-pay | Admitting: Family Medicine

## 2019-10-19 DIAGNOSIS — R7303 Prediabetes: Secondary | ICD-10-CM

## 2019-11-06 ENCOUNTER — Encounter (INDEPENDENT_AMBULATORY_CARE_PROVIDER_SITE_OTHER): Payer: Self-pay | Admitting: Family Medicine

## 2019-11-06 ENCOUNTER — Other Ambulatory Visit: Payer: Self-pay

## 2019-11-06 ENCOUNTER — Ambulatory Visit (INDEPENDENT_AMBULATORY_CARE_PROVIDER_SITE_OTHER): Payer: BC Managed Care – PPO | Admitting: Family Medicine

## 2019-11-06 VITALS — BP 133/81 | HR 66 | Temp 98.0°F | Ht 66.0 in | Wt 293.0 lb

## 2019-11-06 DIAGNOSIS — Z6841 Body Mass Index (BMI) 40.0 and over, adult: Secondary | ICD-10-CM | POA: Diagnosis not present

## 2019-11-06 DIAGNOSIS — R7303 Prediabetes: Secondary | ICD-10-CM

## 2019-11-06 NOTE — Progress Notes (Addendum)
Chief Complaint:   OBESITY Elizabeth Gibson is here to discuss her progress with her obesity treatment plan along with follow-up of her obesity related diagnoses. Elizabeth Gibson is on the Category 3 Plan and keeping a food journal and adhering to recommended goals of 400-600 calories and 40+ grams of protein at lunch or supper daily and states she is following her eating plan approximately 80% of the time. Elizabeth Gibson states she is walking for 30 minutes 6 times per week.  Today's visit was #: 23 Starting weight: 327 lbs Starting date: 07/14/2018 Today's weight: 293 lbs Today's date: 11/06/2019 Total lbs lost to date: 34 Total lbs lost since last in-office visit: 0  Interim History: She is very busy with end of year school activities. 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 She does not always get time to prep lunches. She admits to snacking in the evening after school, sometimes on pepperoni and cheese. Dinner is last around 8 pm after practice. She notes food portions are too big generally.  Subjective:   1. Pre-diabetes Elizabeth Gibson is on metformin BID. She does not report excessive hunger. She does need a snack in the late afternoon because dinner is usually at 8 pm.  Assessment/Plan:   1. Pre-diabetes Elizabeth Gibson will continue metformin, and she will continue to work on weight loss, exercise, and decreasing simple carbohydrates to help decrease the risk of diabetes.  Lab Results  Component Value Date   HGBA1C 5.7 (H) 07/06/2019    2. Class 3 severe obesity with serious comorbidity and body mass index (BMI) of 45.0 to 49.9 in adult, unspecified obesity type [facility]) Elizabeth Gibson is currently in the action stage of change. As such, her goal is to continue with weight loss efforts. She has agreed to the Category 3 Plan or following a lower carbohydrate, vegetable and lean protein rich diet plan.   Elizabeth Gibson will decrease eating out when traveling for sports on the weekends by  packing a cooler.  She will plan a portion controlled high protein snack for after school.  We discussed the Low Carbohydrate plan and she feels this may work for her.  Exercise goals: As is.  Behavioral modification strategies: increasing lean protein intake, decreasing simple carbohydrates, decreasing eating out and meal planning and cooking strategies.  Elizabeth Gibson has agreed to follow-up with our clinic in 3 weeks. She was informed of the importance of frequent follow-up visits to maximize her success with intensive lifestyle modifications for her multiple health conditions.   Objective:   Blood pressure 133/81, pulse 66, temperature 98 F (36.7 C), temperature source Oral, height 5\' 6"  (1.676 m), weight 293 lb (132.9 kg), SpO2 98 %. Body mass index is 47.29 kg/m.  General: Cooperative, alert, well developed, in no acute distress. HEENT: Conjunctivae and lids unremarkable. Cardiovascular: Regular rhythm.  Lungs: Normal work of breathing. Neurologic: No focal deficits.   Lab Results  Component Value Date   CREATININE 0.64 07/06/2019   BUN 13 07/06/2019   NA 138 07/06/2019   K 4.6 07/06/2019   CL 103 07/06/2019   CO2 23 07/06/2019   Lab Results  Component Value Date   ALT 10 07/06/2019   AST 12 07/06/2019   ALKPHOS 85 07/06/2019   BILITOT 0.3 07/06/2019   Lab Results  Component Value Date   HGBA1C 5.7 (H) 07/06/2019   HGBA1C 5.8 03/28/2019   HGBA1C 5.6 12/19/2018   HGBA1C 5.7 (H) 07/14/2018   HGBA1C 5.7 12/27/2013  Lab Results  Component Value Date   INSULIN 18.9 07/06/2019   INSULIN 29.1 (H) 12/19/2018   INSULIN 27.3 (H) 07/14/2018   Lab Results  Component Value Date   TSH 2.90 03/28/2019   Lab Results  Component Value Date   CHOL 178 07/06/2019   HDL 51 07/06/2019   LDLCALC 117 (H) 07/06/2019   TRIG 53 07/06/2019   CHOLHDL 3 12/27/2013   Lab Results  Component Value Date   WBC 7.6 12/19/2018   HGB 10.7 (L) 12/19/2018   HCT 35.5 12/19/2018   MCV 79  12/19/2018   PLT 289.0 12/27/2013   No results found for: IRON, TIBC, FERRITIN  Attestation Statements:   Reviewed by clinician on day of visit: allergies, medications, problem list, medical history, surgical history, family history, social history, and previous encounter notes.   Wilhemena Durie, am acting as Location manager for [employer], FNP-C.  I have reviewed the above documentation for accuracy and completeness, and I agree with the above. -  Georgianne Fick, FNP

## 2019-11-07 ENCOUNTER — Encounter (INDEPENDENT_AMBULATORY_CARE_PROVIDER_SITE_OTHER): Payer: Self-pay | Admitting: Family Medicine

## 2019-11-08 ENCOUNTER — Encounter (INDEPENDENT_AMBULATORY_CARE_PROVIDER_SITE_OTHER): Payer: Self-pay | Admitting: Family Medicine

## 2019-11-08 NOTE — Telephone Encounter (Signed)
Please advise 

## 2019-11-27 ENCOUNTER — Other Ambulatory Visit (INDEPENDENT_AMBULATORY_CARE_PROVIDER_SITE_OTHER): Payer: Self-pay | Admitting: Family Medicine

## 2019-11-27 DIAGNOSIS — R7303 Prediabetes: Secondary | ICD-10-CM

## 2019-12-04 ENCOUNTER — Ambulatory Visit (INDEPENDENT_AMBULATORY_CARE_PROVIDER_SITE_OTHER): Payer: BC Managed Care – PPO | Admitting: Family Medicine

## 2019-12-04 ENCOUNTER — Other Ambulatory Visit: Payer: Self-pay

## 2019-12-04 ENCOUNTER — Encounter (INDEPENDENT_AMBULATORY_CARE_PROVIDER_SITE_OTHER): Payer: Self-pay | Admitting: Family Medicine

## 2019-12-04 VITALS — BP 115/73 | HR 63 | Temp 97.8°F | Ht 66.0 in | Wt 294.0 lb

## 2019-12-04 DIAGNOSIS — E038 Other specified hypothyroidism: Secondary | ICD-10-CM | POA: Diagnosis not present

## 2019-12-04 DIAGNOSIS — E7849 Other hyperlipidemia: Secondary | ICD-10-CM | POA: Diagnosis not present

## 2019-12-04 DIAGNOSIS — E559 Vitamin D deficiency, unspecified: Secondary | ICD-10-CM | POA: Diagnosis not present

## 2019-12-04 DIAGNOSIS — R7303 Prediabetes: Secondary | ICD-10-CM

## 2019-12-04 DIAGNOSIS — Z6841 Body Mass Index (BMI) 40.0 and over, adult: Secondary | ICD-10-CM

## 2019-12-04 DIAGNOSIS — Z9189 Other specified personal risk factors, not elsewhere classified: Secondary | ICD-10-CM

## 2019-12-05 LAB — COMPREHENSIVE METABOLIC PANEL
ALT: 11 IU/L (ref 0–32)
AST: 12 IU/L (ref 0–40)
Albumin/Globulin Ratio: 1.6 (ref 1.2–2.2)
Albumin: 4.5 g/dL (ref 3.8–4.8)
Alkaline Phosphatase: 91 IU/L (ref 48–121)
BUN/Creatinine Ratio: 25 — ABNORMAL HIGH (ref 9–23)
BUN: 16 mg/dL (ref 6–24)
Bilirubin Total: <0.2 mg/dL (ref 0.0–1.2)
CO2: 21 mmol/L (ref 20–29)
Calcium: 9.1 mg/dL (ref 8.7–10.2)
Chloride: 102 mmol/L (ref 96–106)
Creatinine, Ser: 0.64 mg/dL (ref 0.57–1.00)
GFR calc Af Amer: 126 mL/min/{1.73_m2} (ref >59)
GFR calc non Af Amer: 110 mL/min/{1.73_m2} (ref >59)
Globulin, Total: 2.8 g/dL (ref 1.5–4.5)
Glucose: 97 mg/dL (ref 65–99)
Potassium: 4.5 mmol/L (ref 3.5–5.2)
Sodium: 138 mmol/L (ref 134–144)
Total Protein: 7.3 g/dL (ref 6.0–8.5)

## 2019-12-05 LAB — LIPID PANEL WITH LDL/HDL RATIO
Cholesterol, Total: 190 mg/dL (ref 100–199)
HDL: 58 mg/dL (ref >39)
LDL Chol Calc (NIH): 122 mg/dL — ABNORMAL HIGH (ref 0–99)
LDL/HDL Ratio: 2.1 ratio (ref 0.0–3.2)
Triglycerides: 53 mg/dL (ref 0–149)
VLDL Cholesterol Cal: 10 mg/dL (ref 5–40)

## 2019-12-05 LAB — HEMOGLOBIN A1C
Est. average glucose Bld gHb Est-mCnc: 117 mg/dL
Hgb A1c MFr Bld: 5.7 % — ABNORMAL HIGH (ref 4.8–5.6)

## 2019-12-05 LAB — TSH: TSH: 3.39 u[IU]/mL (ref 0.450–4.500)

## 2019-12-05 LAB — INSULIN, RANDOM: INSULIN: 35.6 u[IU]/mL — ABNORMAL HIGH (ref 2.6–24.9)

## 2019-12-05 LAB — VITAMIN D 25 HYDROXY (VIT D DEFICIENCY, FRACTURES): Vit D, 25-Hydroxy: 36.3 ng/mL (ref 30.0–100.0)

## 2019-12-05 LAB — T4, FREE: Free T4: 1.19 ng/dL (ref 0.82–1.77)

## 2019-12-05 LAB — T3: T3, Total: 108 ng/dL (ref 71–180)

## 2019-12-05 NOTE — Progress Notes (Signed)
Chief Complaint:   OBESITY Elizabeth Gibson is here to discuss her progress with her obesity treatment plan along with follow-up of her obesity related diagnoses. Elizabeth Gibson is on the Category 3 Plan or following a lower carbohydrate, vegetable and lean protein rich diet plan and states she is following her eating plan approximately 50% of the time. Elizabeth Gibson states she is walking for 30 minutes 6 times per week.  Today's visit was #: 24 Starting weight: 327 lbs Starting date: 07/14/2018 Today's weight: 294 lbs Today's date: 12/04/2019 Total lbs lost to date: 33 Total lbs lost since last in-office visit: 0  Interim History: Elizabeth Gibson has been off track with her plan recently with her daughter's new onset seizure disorder. She is getting bored with her plan and would like to look at other options. Her daughter's Neurologist suggested her daughter do a lower carbohydrate plan.  Subjective:   1. Pre-diabetes Elizabeth Gibson is stable on metformin, and she denies nausea, vomiting, or hypoglycemia. She is working on diet and exercise.  2. Vitamin D deficiency Elizabeth Gibson is stable on Vit D, and she requests a refill today.  3. Other hyperlipidemia Elizabeth Gibson is working on diet and weight loss to help improve lipids.  4. Other specified hypothyroidism Elizabeth Gibson is stable on Synthroid, and she denies tremors or palpitations.  5. At risk for anxiety Elizabeth Gibson is at risk of developing anxiety due to her daughter's health issues.  Assessment/Plan:   1. Pre-diabetes Elizabeth Gibson will continue to work on weight loss, exercise, and decreasing simple carbohydrates to help decrease the risk of diabetes. We will check labs today, and we will refill metformin for 1 month.  - Comprehensive metabolic panel - Hemoglobin A1c - Insulin, random - Lipid Panel With LDL/HDL Ratio  - metFORMIN (GLUCOPHAGE) 500 MG tablet; Take 1 tablet (500 mg total) by mouth 2 (two) times daily with a meal.  Dispense: 60 tablet; Refill: 0  2. Vitamin D deficiency Elizabeth Gibson  Vitamin D level contributes to fatigue and are associated with obesity, breast, and colon cancer. We will check labs today, and we will refill prescription Vitamin D for 1 month. Elizabeth Gibson will follow-up for routine testing of Vitamin D, at least 2-3 times per year to avoid over-replacement.  - VITAMIN D 25 Hydroxy (Vit-D Deficiency, Fractures)  - Vitamin D, Ergocalciferol, (DRISDOL) 1.25 MG (50000 UNIT) CAPS capsule; Take 1 capsule (50,000 Units total) by mouth every 7 (seven) days.  Dispense: 4 capsule; Refill: 0  3. Other hyperlipidemia Cardiovascular risk and specific lipid/LDL goals reviewed. We discussed several lifestyle modifications today. Elizabeth Gibson will continue to work on diet, exercise and weight loss efforts. We will check labs today. Orders and follow up as documented in patient record.   - Comprehensive metabolic panel - Hemoglobin A1c - Insulin, random - Lipid Panel With LDL/HDL Ratio  4. Other specified hypothyroidism Patient with long-standing hypothyroidism, and Elizabeth Gibson will continue Synthroid. She appears euthyroid. We will check labs today. Orders and follow up as documented in patient record.  - Comprehensive metabolic panel - Hemoglobin A1c - Insulin, random - T3 - T4, free - TSH  5. At risk for anxiety Elizabeth Gibson was given approximately 15 minutes of anxiety risk counseling today. She has risk factors for anxiety including stress. We discussed the importance of a healthy work life balance, a healthy relationship with food and a good support system.  Repetitive spaced learning was employed today to elicit superior memory formation and behavioral change.  6. Class 3 severe obesity with serious  comorbidity and body mass index (BMI) of 45.0 to 49.9 in adult, unspecified obesity type Elizabeth Gibson) Elizabeth Gibson is currently in the action stage of change. As such, her goal is to continue with weight loss efforts. She has agreed to change to following a lower carbohydrate, vegetable and lean protein  rich diet plan.   Exercise goals: As is.  Behavioral modification strategies: increasing lean protein intake.  Elizabeth Gibson has agreed to follow-up with our clinic in 2 weeks. She was informed of the importance of frequent follow-up visits to maximize her success with intensive lifestyle modifications for her multiple health conditions.   Elizabeth Gibson was informed we would discuss her lab results at her next visit unless there is a critical issue that needs to be addressed sooner. Elizabeth Gibson agreed to keep her next visit at the agreed upon time to discuss these results.  Objective:   Blood pressure 115/73, pulse 63, temperature 97.8 F (36.6 C), temperature source Oral, height 5\' 6"  (1.676 m), weight 294 lb (133.4 kg), SpO2 98 %. Body mass index is 47.45 kg/m.  General: Cooperative, alert, well developed, in no acute distress. HEENT: Conjunctivae and lids unremarkable. Cardiovascular: Regular rhythm.  Lungs: Normal work of breathing. Neurologic: No focal deficits.   Lab Results  Component Value Date   CREATININE 0.64 12/04/2019   BUN 16 12/04/2019   NA 138 12/04/2019   K 4.5 12/04/2019   CL 102 12/04/2019   CO2 21 12/04/2019   Lab Results  Component Value Date   ALT 11 12/04/2019   AST 12 12/04/2019   ALKPHOS 91 12/04/2019   BILITOT <0.2 12/04/2019   Lab Results  Component Value Date   HGBA1C 5.7 (H) 12/04/2019   HGBA1C 5.7 (H) 07/06/2019   HGBA1C 5.8 03/28/2019   HGBA1C 5.6 12/19/2018   HGBA1C 5.7 (H) 07/14/2018   Lab Results  Component Value Date   INSULIN 35.6 (H) 12/04/2019   INSULIN 18.9 07/06/2019   INSULIN 29.1 (H) 12/19/2018   INSULIN 27.3 (H) 07/14/2018   Lab Results  Component Value Date   TSH 3.390 12/04/2019   Lab Results  Component Value Date   CHOL 190 12/04/2019   HDL 58 12/04/2019   LDLCALC 122 (H) 12/04/2019   TRIG 53 12/04/2019   CHOLHDL 3 12/27/2013   Lab Results  Component Value Date   WBC 7.6 12/19/2018   HGB 10.7 (L) 12/19/2018   HCT 35.5  12/19/2018   MCV 79 12/19/2018   PLT 289.0 12/27/2013   No results found for: IRON, TIBC, FERRITIN  Attestation Statements:   Reviewed by clinician on day of visit: allergies, medications, problem list, medical history, surgical history, family history, social history, and previous encounter notes.   I, 02/27/2014, am acting as transcriptionist for Burt Knack, MD.  I have reviewed the above documentation for accuracy and completeness, and I agree with the above. -  Quillian Quince, MD

## 2019-12-12 MED ORDER — VITAMIN D (ERGOCALCIFEROL) 1.25 MG (50000 UNIT) PO CAPS: 50000.0000 [IU] | ORAL_CAPSULE | ORAL | 0 refills | Status: DC

## 2019-12-12 MED ORDER — METFORMIN HCL 500 MG PO TABS: 500.0000 mg | ORAL_TABLET | Freq: Two times a day (BID) | ORAL | 0 refills | Status: DC

## 2019-12-18 ENCOUNTER — Ambulatory Visit (INDEPENDENT_AMBULATORY_CARE_PROVIDER_SITE_OTHER): Payer: BC Managed Care – PPO | Admitting: Family Medicine

## 2020-01-04 ENCOUNTER — Other Ambulatory Visit (INDEPENDENT_AMBULATORY_CARE_PROVIDER_SITE_OTHER): Payer: Self-pay | Admitting: Family Medicine

## 2020-01-04 DIAGNOSIS — R7303 Prediabetes: Secondary | ICD-10-CM

## 2020-02-24 ENCOUNTER — Other Ambulatory Visit (INDEPENDENT_AMBULATORY_CARE_PROVIDER_SITE_OTHER): Payer: Self-pay | Admitting: Family Medicine

## 2020-02-24 DIAGNOSIS — R7303 Prediabetes: Secondary | ICD-10-CM

## 2020-02-26 ENCOUNTER — Other Ambulatory Visit: Payer: Self-pay

## 2020-02-26 ENCOUNTER — Ambulatory Visit (INDEPENDENT_AMBULATORY_CARE_PROVIDER_SITE_OTHER): Payer: BC Managed Care – PPO | Admitting: Family Medicine

## 2020-02-26 VITALS — BP 121/78 | HR 60 | Temp 98.0°F | Ht 66.0 in | Wt 305.0 lb

## 2020-02-26 DIAGNOSIS — R7303 Prediabetes: Secondary | ICD-10-CM

## 2020-02-26 DIAGNOSIS — Z6841 Body Mass Index (BMI) 40.0 and over, adult: Secondary | ICD-10-CM

## 2020-02-26 DIAGNOSIS — E559 Vitamin D deficiency, unspecified: Secondary | ICD-10-CM

## 2020-02-26 DIAGNOSIS — Z9189 Other specified personal risk factors, not elsewhere classified: Secondary | ICD-10-CM

## 2020-02-26 MED ORDER — VITAMIN D (ERGOCALCIFEROL) 1.25 MG (50000 UNIT) PO CAPS: 50000.0000 [IU] | ORAL_CAPSULE | ORAL | 0 refills | Status: DC

## 2020-02-26 MED ORDER — METFORMIN HCL 500 MG PO TABS: 500.0000 mg | ORAL_TABLET | Freq: Two times a day (BID) | ORAL | 0 refills | Status: DC

## 2020-02-26 NOTE — Progress Notes (Signed)
Chief Complaint:   OBESITY Elizabeth Gibson is here to discuss her progress with her obesity treatment plan along with follow-up of her obesity related diagnoses. Elizabeth Gibson is on following a lower carbohydrate, vegetable and lean protein rich diet plan and states she is following her eating plan approximately 0% of the time. Elizabeth Gibson states she is doing 0 minutes 0 times per week.  Today's visit was #: 25 Starting weight: 327 lbs Starting date: 07/14/2018 Today's weight: 305 lbs Today's date: 02/26/2020 Total lbs lost to date: 22 Total lbs lost since last in-office visit: 0  Interim History: Elizabeth Gibson's last visit was approximately 3 months ago. She has been dealing with her daughter's new onset seizure disorder. She has not been able to concentrate on weight loss during this time, but she is now at a point where she is ready to get back on track.  Subjective:   1. Pre-diabetes Elizabeth Gibson is ready to work on diet and weight loss again. She denies nausea, vomiting, or hypoglycemia. I discussed labs with the patient today.  2. Vitamin D deficiency Elizabeth Gibson's Vit D level is not yet at goal, and she denies nausea or vomiting.  3. At risk for diabetes mellitus Elizabeth Gibson is at higher than average risk for developing diabetes due to her obesity.   Assessment/Plan:   1. Pre-diabetes Elizabeth Gibson will continue to work on weight loss, exercise, and decreasing simple carbohydrates to help decrease the risk of diabetes. We will refill metformin for 1 month.  - metFORMIN (GLUCOPHAGE) 500 MG tablet; Take 1 tablet (500 mg total) by mouth 2 (two) times daily with a meal.  Dispense: 60 tablet; Refill: 0  2. Vitamin D deficiency Low Vitamin D level contributes to fatigue and are associated with obesity, breast, and colon cancer. We will refill prescription Vitamin D for 1 month. Elizabeth Gibson will follow-up for routine testing of Vitamin D, at least 2-3 times per year to avoid over-replacement.  - Vitamin D, Ergocalciferol, (DRISDOL) 1.25  MG (50000 UNIT) CAPS capsule; Take 1 capsule (50,000 Units total) by mouth every 7 (seven) days.  Dispense: 4 capsule; Refill: 0  3. At risk for diabetes mellitus Elizabeth Gibson was given approximately 15 minutes of diabetes education and counseling today. We discussed intensive lifestyle modifications today with an emphasis on weight loss as well as increasing exercise and decreasing simple carbohydrates in her diet. We also reviewed medication options with an emphasis on risk versus benefit of those discussed.   Repetitive spaced learning was employed today to elicit superior memory formation and behavioral change.  4. Class 3 severe obesity with serious comorbidity and body mass index (BMI) of 45.0 to 49.9 in adult, unspecified obesity type Elizabeth Gibson) Elizabeth Gibson is currently in the action stage of change. As such, her goal is to get back to weightloss efforts . She has agreed to restart the Category 3 Plan.   Behavioral modification strategies: increasing lean protein intake and meal planning and cooking strategies.  Elizabeth Gibson has agreed to follow-up with our clinic in 2 to 3 weeks. She was informed of the importance of frequent follow-up visits to maximize her success with intensive lifestyle modifications for her multiple health conditions.   Objective:   Blood pressure 121/78, pulse 60, temperature 98 F (36.7 C), height 5\' 6"  (1.676 m), weight (!) 305 lb (138.3 kg), SpO2 98 %. Body mass index is 49.23 kg/m.  General: Cooperative, alert, well developed, in no acute distress. HEENT: Conjunctivae and lids unremarkable. Cardiovascular: Regular rhythm.  Lungs: Normal work  of breathing. Neurologic: No focal deficits.   Lab Results  Component Value Date   CREATININE 0.64 12/04/2019   BUN 16 12/04/2019   NA 138 12/04/2019   K 4.5 12/04/2019   CL 102 12/04/2019   CO2 21 12/04/2019   Lab Results  Component Value Date   ALT 11 12/04/2019   AST 12 12/04/2019   ALKPHOS 91 12/04/2019   BILITOT <0.2  12/04/2019   Lab Results  Component Value Date   HGBA1C 5.7 (H) 12/04/2019   HGBA1C 5.7 (H) 07/06/2019   HGBA1C 5.8 03/28/2019   HGBA1C 5.6 12/19/2018   HGBA1C 5.7 (H) 07/14/2018   Lab Results  Component Value Date   INSULIN 35.6 (H) 12/04/2019   INSULIN 18.9 07/06/2019   INSULIN 29.1 (H) 12/19/2018   INSULIN 27.3 (H) 07/14/2018   Lab Results  Component Value Date   TSH 3.390 12/04/2019   Lab Results  Component Value Date   CHOL 190 12/04/2019   HDL 58 12/04/2019   LDLCALC 122 (H) 12/04/2019   TRIG 53 12/04/2019   CHOLHDL 3 12/27/2013   Lab Results  Component Value Date   WBC 7.6 12/19/2018   HGB 10.7 (L) 12/19/2018   HCT 35.5 12/19/2018   MCV 79 12/19/2018   PLT 289.0 12/27/2013   No results found for: IRON, TIBC, FERRITIN  Attestation Statements:   Reviewed by clinician on day of visit: allergies, medications, problem list, medical history, surgical history, family history, social history, and previous encounter notes.   I, Burt Knack, am acting as transcriptionist for Quillian Quince, MD.  I have reviewed the above documentation for accuracy and completeness, and I agree with the above. -  Quillian Quince, MD

## 2020-03-12 ENCOUNTER — Other Ambulatory Visit: Payer: Self-pay

## 2020-03-12 ENCOUNTER — Ambulatory Visit (INDEPENDENT_AMBULATORY_CARE_PROVIDER_SITE_OTHER): Payer: BC Managed Care – PPO | Admitting: Family Medicine

## 2020-03-12 ENCOUNTER — Encounter (INDEPENDENT_AMBULATORY_CARE_PROVIDER_SITE_OTHER): Payer: Self-pay | Admitting: Family Medicine

## 2020-03-12 VITALS — BP 126/80 | HR 59 | Temp 97.7°F | Ht 66.0 in | Wt 307.0 lb

## 2020-03-12 DIAGNOSIS — R7303 Prediabetes: Secondary | ICD-10-CM

## 2020-03-12 DIAGNOSIS — Z6841 Body Mass Index (BMI) 40.0 and over, adult: Secondary | ICD-10-CM | POA: Diagnosis not present

## 2020-03-12 DIAGNOSIS — Z9189 Other specified personal risk factors, not elsewhere classified: Secondary | ICD-10-CM

## 2020-03-12 DIAGNOSIS — E559 Vitamin D deficiency, unspecified: Secondary | ICD-10-CM | POA: Diagnosis not present

## 2020-03-12 MED ORDER — VITAMIN D (ERGOCALCIFEROL) 1.25 MG (50000 UNIT) PO CAPS: 50000.0000 [IU] | ORAL_CAPSULE | ORAL | 0 refills | Status: DC

## 2020-03-12 MED ORDER — METFORMIN HCL 500 MG PO TABS: 500.0000 mg | ORAL_TABLET | Freq: Two times a day (BID) | ORAL | 0 refills | Status: DC

## 2020-03-12 MED ORDER — SAXENDA 18 MG/3ML ~~LOC~~ SOPN: 3.0000 mg | PEN_INJECTOR | Freq: Every day | SUBCUTANEOUS | 0 refills | Status: DC

## 2020-03-12 NOTE — Progress Notes (Signed)
Chief Complaint:   OBESITY Elizabeth Gibson is here to discuss her progress with her obesity treatment plan along with follow-up of her obesity related diagnoses. Elizabeth Gibson is on the Category 3 Plan and states she is following her eating plan approximately 80% of the time. Elizabeth Gibson states she is walking for 50 minutes 3 times per week.  Today's visit was #: 26 Starting weight: 327 lbs Starting date: 07/14/2018 Today's weight: 307 lbs Today's date: 03/12/2020 Total lbs lost to date: 20 Total lbs lost since last in-office visit: 0  Interim History: Elizabeth Gibson continues to struggle with weight loss and has gained approximately 30+ lbs in the last year with the pandemic which unfortunately is national average. She is still down 20 lbs from her starting weight.  Subjective:   1. Pre-diabetes Elizabeth Gibson is struggling with polyphagia and weight loss. He denies hypoglycemia.  2. Vitamin D deficiency Elizabeth Gibson is stable on Vit D, and she denies nausea or vomiting. She requests a refill today.  3. At risk for diabetes mellitus Elizabeth Gibson is at higher than average risk for developing diabetes due to her obesity.   Assessment/Plan:   1. Pre-diabetes Elizabeth Gibson will continue to work on weight loss, exercise, and decreasing simple carbohydrates to help decrease the risk of diabetes. Elizabeth Gibson agreed to start Saxenda, and we will refill metformin for 1 month.  - metFORMIN (GLUCOPHAGE) 500 MG tablet; Take 1 tablet (500 mg total) by mouth 2 (two) times daily with a meal.  Dispense: 60 tablet; Refill: 0  2. Vitamin D deficiency Low Vitamin D level contributes to fatigue and are associated with obesity, breast, and colon cancer. We will refill prescription Vitamin D for 1 month. Elizabeth Gibson will follow-up for routine testing of Vitamin D, at least 2-3 times per year to avoid over-replacement.  - Vitamin D, Ergocalciferol, (DRISDOL) 1.25 MG (50000 UNIT) CAPS capsule; Take 1 capsule (50,000 Units total) by mouth every 7 (seven) days.  Dispense: 4  capsule; Refill: 0  3. At risk for diabetes mellitus Elizabeth Gibson was given approximately 15 minutes of diabetes education and counseling today. We discussed intensive lifestyle modifications today with an emphasis on weight loss as well as increasing exercise and decreasing simple carbohydrates in her diet. We also reviewed medication options with an emphasis on risk versus benefit of those discussed.   Repetitive spaced learning was employed today to elicit superior memory formation and behavioral change.  4. Class 3 severe obesity with serious comorbidity and body mass index (BMI) of 45.0 to 49.9 in adult, unspecified obesity type Elizabeth Gibson) Elizabeth Gibson is currently in the action stage of change. As such, her goal is to continue with weight loss efforts. She has agreed to the Category 3 Plan.   We discussed various medication options to help Elizabeth Gibson with her weight loss efforts and we both agreed to start Saxenda 3.0 mg q daily (she is start at 0.6 mg daily), and nano needles.  - Liraglutide -Weight Management (SAXENDA) 18 MG/3ML SOPN; Inject 3 mg into the skin daily.  Dispense: 18 mL; Refill: 0  Exercise goals: As is.  Behavioral modification strategies: increasing lean protein intake.  Elizabeth Gibson has agreed to follow-up with our clinic in 2 to 3 weeks. She was informed of the importance of frequent follow-up visits to maximize her success with intensive lifestyle modifications for her multiple health conditions.   Objective:   Blood pressure 126/80, pulse (!) 59, temperature 97.7 F (36.5 C), height 5\' 6"  (1.676 m), weight (!) 307 lb (139.3 kg),  SpO2 98 %. Body mass index is 49.55 kg/m.  General: Cooperative, alert, well developed, in no acute distress. HEENT: Conjunctivae and lids unremarkable. Cardiovascular: Regular rhythm.  Lungs: Normal work of breathing. Neurologic: No focal deficits.   Lab Results  Component Value Date   CREATININE 0.64 12/04/2019   BUN 16 12/04/2019   NA 138 12/04/2019   K  4.5 12/04/2019   CL 102 12/04/2019   CO2 21 12/04/2019   Lab Results  Component Value Date   ALT 11 12/04/2019   AST 12 12/04/2019   ALKPHOS 91 12/04/2019   BILITOT <0.2 12/04/2019   Lab Results  Component Value Date   HGBA1C 5.7 (H) 12/04/2019   HGBA1C 5.7 (H) 07/06/2019   HGBA1C 5.8 03/28/2019   HGBA1C 5.6 12/19/2018   HGBA1C 5.7 (H) 07/14/2018   Lab Results  Component Value Date   INSULIN 35.6 (H) 12/04/2019   INSULIN 18.9 07/06/2019   INSULIN 29.1 (H) 12/19/2018   INSULIN 27.3 (H) 07/14/2018   Lab Results  Component Value Date   TSH 3.390 12/04/2019   Lab Results  Component Value Date   CHOL 190 12/04/2019   HDL 58 12/04/2019   LDLCALC 122 (H) 12/04/2019   TRIG 53 12/04/2019   CHOLHDL 3 12/27/2013   Lab Results  Component Value Date   WBC 7.6 12/19/2018   HGB 10.7 (L) 12/19/2018   HCT 35.5 12/19/2018   MCV 79 12/19/2018   PLT 289.0 12/27/2013   No results found for: IRON, TIBC, FERRITIN  Attestation Statements:   Reviewed by clinician on day of visit: allergies, medications, problem list, medical history, surgical history, family history, social history, and previous encounter notes.   I, Burt Knack, am acting as transcriptionist for Quillian Quince, MD.  I have reviewed the above documentation for accuracy and completeness, and I agree with the above. -  Quillian Quince, MD

## 2020-03-20 ENCOUNTER — Other Ambulatory Visit (INDEPENDENT_AMBULATORY_CARE_PROVIDER_SITE_OTHER): Payer: Self-pay | Admitting: Family Medicine

## 2020-03-20 DIAGNOSIS — R7303 Prediabetes: Secondary | ICD-10-CM

## 2020-03-21 ENCOUNTER — Encounter (INDEPENDENT_AMBULATORY_CARE_PROVIDER_SITE_OTHER): Payer: Self-pay

## 2020-03-26 ENCOUNTER — Ambulatory Visit (INDEPENDENT_AMBULATORY_CARE_PROVIDER_SITE_OTHER): Payer: BC Managed Care – PPO | Admitting: Family Medicine

## 2020-04-07 ENCOUNTER — Other Ambulatory Visit (INDEPENDENT_AMBULATORY_CARE_PROVIDER_SITE_OTHER): Payer: Self-pay | Admitting: Family Medicine

## 2020-04-07 DIAGNOSIS — R7303 Prediabetes: Secondary | ICD-10-CM

## 2020-04-19 ENCOUNTER — Other Ambulatory Visit (INDEPENDENT_AMBULATORY_CARE_PROVIDER_SITE_OTHER): Payer: Self-pay | Admitting: Family Medicine

## 2020-04-19 DIAGNOSIS — E559 Vitamin D deficiency, unspecified: Secondary | ICD-10-CM

## 2020-07-08 ENCOUNTER — Other Ambulatory Visit: Payer: Self-pay

## 2020-07-08 ENCOUNTER — Encounter (INDEPENDENT_AMBULATORY_CARE_PROVIDER_SITE_OTHER): Payer: Self-pay | Admitting: Family Medicine

## 2020-07-08 ENCOUNTER — Ambulatory Visit (INDEPENDENT_AMBULATORY_CARE_PROVIDER_SITE_OTHER): Payer: BC Managed Care – PPO | Admitting: Family Medicine

## 2020-07-08 VITALS — BP 130/81 | HR 84 | Temp 98.5°F | Ht 66.0 in | Wt 328.0 lb

## 2020-07-08 DIAGNOSIS — Z9189 Other specified personal risk factors, not elsewhere classified: Secondary | ICD-10-CM | POA: Diagnosis not present

## 2020-07-08 DIAGNOSIS — E559 Vitamin D deficiency, unspecified: Secondary | ICD-10-CM

## 2020-07-08 DIAGNOSIS — R7303 Prediabetes: Secondary | ICD-10-CM | POA: Diagnosis not present

## 2020-07-08 DIAGNOSIS — Z6841 Body Mass Index (BMI) 40.0 and over, adult: Secondary | ICD-10-CM

## 2020-07-08 MED ORDER — SAXENDA 18 MG/3ML ~~LOC~~ SOPN: 3.0000 mg | PEN_INJECTOR | Freq: Every day | SUBCUTANEOUS | 0 refills | Status: DC

## 2020-07-08 MED ORDER — VITAMIN D (ERGOCALCIFEROL) 1.25 MG (50000 UNIT) PO CAPS: 50000.0000 [IU] | ORAL_CAPSULE | ORAL | 0 refills | Status: DC

## 2020-07-08 MED ORDER — METFORMIN HCL 500 MG PO TABS: 500.0000 mg | ORAL_TABLET | Freq: Two times a day (BID) | ORAL | 0 refills | Status: DC

## 2020-07-09 NOTE — Progress Notes (Signed)
Chief Complaint:   OBESITY Elizabeth Gibson is here to discuss her progress with her obesity treatment plan along with follow-up of her obesity related diagnoses. Elizabeth Gibson is on the Category 3 Plan and states she is following her eating plan approximately 0% of the time. Elizabeth Gibson states she is walking for 30 minutes 2 times per week.  Today's visit was #: 27 Starting weight: 327 lbs Starting date: 07/14/2018 Today's weight: 328 lbs Today's date: 07/08/2020 Total lbs lost to date: 0 Total lbs lost since last in-office visit: 0  Interim History: Elizabeth Gibson's last visit was approximately 4 months ago. She is back to her starting weight which was right before the pandemic started. She is ready get back on track with her eating plan.  Subjective:   1. Pre-diabetes Elizabeth Gibson is ready to work on her diet again, and she would lie a refill of metformin.  2. Vitamin D deficiency Elizabeth Gibson is out of her Vit D, and she requests a refill today.  3. At risk for diabetes mellitus Elizabeth Gibson is at higher than average risk for developing diabetes due to obesity.   Assessment/Plan:   1. Pre-diabetes Elizabeth Gibson will continue to work on weight loss, exercise, and decreasing simple carbohydrates to help decrease the risk of diabetes. We will refill metformin for 1 month, and we will recheck labs in 1 month.  - metFORMIN (GLUCOPHAGE) 500 MG tablet; Take 1 tablet (500 mg total) by mouth 2 (two) times daily with a meal.  Dispense: 60 tablet; Refill: 0  2. Vitamin D deficiency Low Vitamin D level contributes to fatigue and are associated with obesity, breast, and colon cancer. We will refill prescription Vitamin D for 1 month, and we will recheck labs in 1 month. Elizabeth Gibson will follow-up for routine testing of Vitamin D, at least 2-3 times per year to avoid over-replacement.  - Vitamin D, Ergocalciferol, (DRISDOL) 1.25 MG (50000 UNIT) CAPS capsule; Take 1 capsule (50,000 Units total) by mouth every 7 (seven) days.  Dispense: 4 capsule;  Refill: 0  3. At risk for diabetes mellitus Elizabeth Gibson was given approximately 15 minutes of diabetes education and counseling today. We discussed intensive lifestyle modifications today with an emphasis on weight loss as well as increasing exercise and decreasing simple carbohydrates in her diet. We also reviewed medication options with an emphasis on risk versus benefit of those discussed.   Repetitive spaced learning was employed today to elicit superior memory formation and behavioral change.  4. Class 3 severe obesity with serious comorbidity and body mass index (BMI) of 50.0 to 59.9 in adult, unspecified obesity type Elizabeth Gibson) Elizabeth Gibson is currently in the action stage of change. As such, her goal is to continue with weight loss efforts. She has agreed to the Category 3 Plan.   We discussed various medication options to help Elizabeth Gibson with her weight loss efforts and we both agreed to continue Saxenda, and we will refill for 1 month.  - Liraglutide -Weight Management (SAXENDA) 18 MG/3ML SOPN; Inject 3 mg into the skin daily.  Dispense: 18 mL; Refill: 0  Exercise goals: As is.  Behavioral modification strategies: increasing lean protein intake, meal planning and cooking strategies and dealing with family or coworker sabotage.  Elizabeth Gibson has agreed to follow-up with our clinic in 2 weeks. She was informed of the importance of frequent follow-up visits to maximize her success with intensive lifestyle modifications for her multiple health conditions.   Objective:   Blood pressure 130/81, pulse 84, temperature 98.5 F (36.9 C),  height 5\' 6"  (1.676 m), weight (!) 328 lb (148.8 kg), SpO2 95 %. Body mass index is 52.94 kg/m.  General: Cooperative, alert, well developed, in no acute distress. HEENT: Conjunctivae and lids unremarkable. Cardiovascular: Regular rhythm.  Lungs: Normal work of breathing. Neurologic: No focal deficits.   Lab Results  Component Value Date   CREATININE 0.64 12/04/2019   BUN 16  12/04/2019   NA 138 12/04/2019   K 4.5 12/04/2019   CL 102 12/04/2019   CO2 21 12/04/2019   Lab Results  Component Value Date   ALT 11 12/04/2019   AST 12 12/04/2019   ALKPHOS 91 12/04/2019   BILITOT <0.2 12/04/2019   Lab Results  Component Value Date   HGBA1C 5.7 (H) 12/04/2019   HGBA1C 5.7 (H) 07/06/2019   HGBA1C 5.8 03/28/2019   HGBA1C 5.6 12/19/2018   HGBA1C 5.7 (H) 07/14/2018   Lab Results  Component Value Date   INSULIN 35.6 (H) 12/04/2019   INSULIN 18.9 07/06/2019   INSULIN 29.1 (H) 12/19/2018   INSULIN 27.3 (H) 07/14/2018   Lab Results  Component Value Date   TSH 3.390 12/04/2019   Lab Results  Component Value Date   CHOL 190 12/04/2019   HDL 58 12/04/2019   LDLCALC 122 (H) 12/04/2019   TRIG 53 12/04/2019   CHOLHDL 3 12/27/2013   Lab Results  Component Value Date   WBC 7.6 12/19/2018   HGB 10.7 (L) 12/19/2018   HCT 35.5 12/19/2018   MCV 79 12/19/2018   PLT 289.0 12/27/2013   No results found for: IRON, TIBC, FERRITIN  Attestation Statements:   Reviewed by clinician on day of visit: allergies, medications, problem list, medical history, surgical history, family history, social history, and previous encounter notes.    I, 02/27/2014, am acting as transcriptionist for Burt Knack, MD.  I have reviewed the above documentation for accuracy and completeness, and I agree with the above. -  Quillian Quince, MD

## 2020-07-23 ENCOUNTER — Encounter (INDEPENDENT_AMBULATORY_CARE_PROVIDER_SITE_OTHER): Payer: Self-pay | Admitting: Family Medicine

## 2020-07-23 ENCOUNTER — Other Ambulatory Visit: Payer: Self-pay

## 2020-07-23 ENCOUNTER — Ambulatory Visit (INDEPENDENT_AMBULATORY_CARE_PROVIDER_SITE_OTHER): Payer: BC Managed Care – PPO | Admitting: Family Medicine

## 2020-07-23 VITALS — BP 139/82 | HR 90 | Temp 97.9°F | Ht 66.0 in | Wt 329.0 lb

## 2020-07-23 DIAGNOSIS — I10 Essential (primary) hypertension: Secondary | ICD-10-CM | POA: Diagnosis not present

## 2020-07-23 DIAGNOSIS — E559 Vitamin D deficiency, unspecified: Secondary | ICD-10-CM | POA: Diagnosis not present

## 2020-07-23 DIAGNOSIS — Z9189 Other specified personal risk factors, not elsewhere classified: Secondary | ICD-10-CM

## 2020-07-23 DIAGNOSIS — E038 Other specified hypothyroidism: Secondary | ICD-10-CM

## 2020-07-23 DIAGNOSIS — E7849 Other hyperlipidemia: Secondary | ICD-10-CM

## 2020-07-23 DIAGNOSIS — R7303 Prediabetes: Secondary | ICD-10-CM

## 2020-07-23 DIAGNOSIS — Z6841 Body Mass Index (BMI) 40.0 and over, adult: Secondary | ICD-10-CM

## 2020-07-23 MED ORDER — LOSARTAN POTASSIUM-HCTZ 50-12.5 MG PO TABS: 1.0000 | ORAL_TABLET | Freq: Every day | ORAL | 0 refills | Status: DC
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
## 2020-07-24 LAB — LIPID PANEL WITH LDL/HDL RATIO
Cholesterol, Total: 219 mg/dL — ABNORMAL HIGH (ref 100–199)
HDL: 52 mg/dL (ref >39)
LDL Chol Calc (NIH): 142 mg/dL — ABNORMAL HIGH (ref 0–99)
LDL/HDL Ratio: 2.7 ratio (ref 0.0–3.2)
Triglycerides: 138 mg/dL (ref 0–149)
VLDL Cholesterol Cal: 25 mg/dL (ref 5–40)

## 2020-07-24 LAB — COMPREHENSIVE METABOLIC PANEL
ALT: 16 IU/L (ref 0–32)
AST: 17 IU/L (ref 0–40)
Albumin/Globulin Ratio: 1.4 (ref 1.2–2.2)
Albumin: 4.5 g/dL (ref 3.8–4.8)
Alkaline Phosphatase: 104 IU/L (ref 44–121)
BUN/Creatinine Ratio: 22 (ref 9–23)
BUN: 15 mg/dL (ref 6–24)
Bilirubin Total: 0.3 mg/dL (ref 0.0–1.2)
CO2: 22 mmol/L (ref 20–29)
Calcium: 9.3 mg/dL (ref 8.7–10.2)
Chloride: 101 mmol/L (ref 96–106)
Creatinine, Ser: 0.68 mg/dL (ref 0.57–1.00)
GFR calc Af Amer: 123 mL/min/{1.73_m2} (ref >59)
GFR calc non Af Amer: 107 mL/min/{1.73_m2} (ref >59)
Globulin, Total: 3.2 g/dL (ref 1.5–4.5)
Glucose: 80 mg/dL (ref 65–99)
Potassium: 4.3 mmol/L (ref 3.5–5.2)
Sodium: 141 mmol/L (ref 134–144)
Total Protein: 7.7 g/dL (ref 6.0–8.5)

## 2020-07-24 LAB — INSULIN, RANDOM: INSULIN: 34.7 u[IU]/mL — ABNORMAL HIGH (ref 2.6–24.9)

## 2020-07-24 LAB — VITAMIN D 25 HYDROXY (VIT D DEFICIENCY, FRACTURES): Vit D, 25-Hydroxy: 19.4 ng/mL — ABNORMAL LOW (ref 30.0–100.0)

## 2020-07-24 LAB — HEMOGLOBIN A1C
Est. average glucose Bld gHb Est-mCnc: 128 mg/dL
Hgb A1c MFr Bld: 6.1 % — ABNORMAL HIGH (ref 4.8–5.6)

## 2020-07-24 LAB — TSH: TSH: 3.98 u[IU]/mL (ref 0.450–4.500)

## 2020-07-24 LAB — T4, FREE: Free T4: 1.27 ng/dL (ref 0.82–1.77)

## 2020-07-24 LAB — T3: T3, Total: 139 ng/dL (ref 71–180)

## 2020-07-29 ENCOUNTER — Telehealth (INDEPENDENT_AMBULATORY_CARE_PROVIDER_SITE_OTHER): Payer: Self-pay

## 2020-07-29 NOTE — Telephone Encounter (Signed)
Received fax from [facility] [employer] stating that pt has been approved for Saxenda.  Approval is good from 07/29/2020 through 07/29/2021

## 2020-07-29 NOTE — Telephone Encounter (Signed)
PA has been initiated via [email or web address] for Saxenda.   Elizabeth Gibson (Key: [id number]) Saxenda 18MG /3ML pen-injectors   Form PA Form (2017 NCPDP) Created 10 days ago Sent to Plan 7 minutes ago Plan Response 7 minutes ago Submit Clinical Questions less than a minute ago Determination Wait for Determination Please wait for [employer] NCPDP 2017 to return a determination.

## 2020-07-30 ENCOUNTER — Other Ambulatory Visit (INDEPENDENT_AMBULATORY_CARE_PROVIDER_SITE_OTHER): Payer: Self-pay | Admitting: Family Medicine

## 2020-07-30 DIAGNOSIS — R7303 Prediabetes: Secondary | ICD-10-CM

## 2020-08-06 ENCOUNTER — Ambulatory Visit (INDEPENDENT_AMBULATORY_CARE_PROVIDER_SITE_OTHER): Payer: BC Managed Care – PPO | Admitting: Family Medicine

## 2020-08-17 ENCOUNTER — Other Ambulatory Visit (INDEPENDENT_AMBULATORY_CARE_PROVIDER_SITE_OTHER): Payer: Self-pay | Admitting: Family Medicine

## 2020-08-17 DIAGNOSIS — I10 Essential (primary) hypertension: Secondary | ICD-10-CM

## 2020-08-20 ENCOUNTER — Encounter (INDEPENDENT_AMBULATORY_CARE_PROVIDER_SITE_OTHER): Payer: Self-pay | Admitting: Family Medicine

## 2020-08-20 ENCOUNTER — Other Ambulatory Visit: Payer: Self-pay

## 2020-08-20 ENCOUNTER — Ambulatory Visit (INDEPENDENT_AMBULATORY_CARE_PROVIDER_SITE_OTHER): Payer: BC Managed Care – PPO | Admitting: Family Medicine

## 2020-08-20 VITALS — BP 140/87 | HR 77 | Temp 98.0°F | Ht 66.0 in | Wt 328.0 lb

## 2020-08-20 DIAGNOSIS — E559 Vitamin D deficiency, unspecified: Secondary | ICD-10-CM

## 2020-08-20 DIAGNOSIS — Z6841 Body Mass Index (BMI) 40.0 and over, adult: Secondary | ICD-10-CM

## 2020-08-20 DIAGNOSIS — Z9189 Other specified personal risk factors, not elsewhere classified: Secondary | ICD-10-CM

## 2020-08-20 DIAGNOSIS — I10 Essential (primary) hypertension: Secondary | ICD-10-CM | POA: Diagnosis not present

## 2020-08-20 DIAGNOSIS — R7303 Prediabetes: Secondary | ICD-10-CM

## 2020-08-20 MED ORDER — METFORMIN HCL 500 MG PO TABS: 500.0000 mg | ORAL_TABLET | Freq: Two times a day (BID) | ORAL | 0 refills | Status: DC

## 2020-08-20 MED ORDER — VITAMIN D (ERGOCALCIFEROL) 1.25 MG (50000 UNIT) PO CAPS: 50000.0000 [IU] | ORAL_CAPSULE | ORAL | 0 refills | Status: DC

## 2020-08-21 NOTE — Progress Notes (Signed)
Chief Complaint:   OBESITY Elizabeth Gibson is here to discuss her progress with her obesity treatment plan along with follow-up of her obesity related diagnoses. Elizabeth Gibson is on the Category 3 Plan and states she is following her eating plan approximately 70% of the time. Elizabeth Gibson states she is walking for 30-60 minutes 3 times per week.  Today's visit was #: 29 Starting weight: 327 lbs Starting date: 07/14/2018 Today's weight: 328 lbs Today's date: 08/20/2020 Total lbs lost to date: 0 Total lbs lost since last in-office visit: 1  Interim History: Elizabeth Gibson has been doing better with weight loss. Her family is working on eating healthier with her and this makes meal planning easier.  Subjective:   1. Vitamin D deficiency Elizabeth Gibson's Vit D is worsening and has fallen, and her Vit D level is not yet at goal. She needs a Vit D refill. I discussed labs with the patient today.  2. Pre-diabetes Elizabeth Gibson is stable on metformin and GLP-1, and she notes decreased polyphagia.  3. Essential hypertension Elizabeth Gibson's blood pressure is elevated today, normally well controlled on her medications. She is working on diet, exercise, and weight loss.  4. At risk for heart disease Elizabeth Gibson is at a higher than average risk for cardiovascular disease due to obesity.   Assessment/Plan:   1. Vitamin D deficiency Low Vitamin D level contributes to fatigue and are associated with obesity, breast, and colon cancer. We will refill prescription Vitamin D for 1 month. Elizabeth Gibson will follow-up for routine testing of Vitamin D, at least 2-3 times per year to avoid over-replacement.  - Vitamin D, Ergocalciferol, (DRISDOL) 1.25 MG (50000 UNIT) CAPS capsule; Take 1 capsule (50,000 Units total) by mouth every 7 (seven) days.  Dispense: 4 capsule; Refill: 0  2. Pre-diabetes Elizabeth Gibson will continue to work on weight loss, exercise, and decreasing simple carbohydrates to help decrease the risk of diabetes. We will refill metformin for 1 month.  -  metFORMIN (GLUCOPHAGE) 500 MG tablet; Take 1 tablet (500 mg total) by mouth 2 (two) times daily with a meal.  Dispense: 60 tablet; Refill: 0  3. Essential hypertension Elizabeth Gibson will continue her Category 3 plan and her medications. She will continue working on healthy weight loss and exercise to improve blood pressure control. We will recheck her blood pressure in 2 weeks. We will watch for signs of hypotension as she continues her lifestyle modifications.  4. At risk for heart disease Elizabeth Gibson was given approximately 15 minutes of coronary artery disease prevention counseling today. She is 45 y.o. female and has risk factors for heart disease including obesity. We discussed intensive lifestyle modifications today with an emphasis on specific weight loss instructions and strategies.   Repetitive spaced learning was employed today to elicit superior memory formation and behavioral change.  5. Class 3 severe obesity with serious comorbidity and body mass index (BMI) of 50.0 to 59.9 in adult, unspecified obesity type Elizabeth Gibson) Elizabeth Gibson is currently in the action stage of change. As such, her goal is to continue with weight loss efforts. She has agreed to the Category 3 Plan.   Exercise goals: As is.  Behavioral modification strategies: decreasing simple carbohydrates, meal planning and cooking strategies and dealing with family or coworker sabotage.  Elizabeth Gibson has agreed to follow-up with our clinic in 3 to 4 weeks. She was informed of the importance of frequent follow-up visits to maximize her success with intensive lifestyle modifications for her multiple health conditions.   Objective:   Blood pressure 140/87,  pulse 77, temperature 98 F (36.7 C), height 5\' 6"  (1.676 m), weight (!) 328 lb (148.8 kg), SpO2 98 %. Body mass index is 52.94 kg/m.  General: Cooperative, alert, well developed, in no acute distress. HEENT: Conjunctivae and lids unremarkable. Cardiovascular: Regular rhythm.  Lungs: Normal work of  breathing. Neurologic: No focal deficits.   Lab Results  Component Value Date   CREATININE 0.68 07/23/2020   BUN 15 07/23/2020   NA 141 07/23/2020   K 4.3 07/23/2020   CL 101 07/23/2020   CO2 22 07/23/2020   Lab Results  Component Value Date   ALT 16 07/23/2020   AST 17 07/23/2020   ALKPHOS 104 07/23/2020   BILITOT 0.3 07/23/2020   Lab Results  Component Value Date   HGBA1C 6.1 (H) 07/23/2020   HGBA1C 5.7 (H) 12/04/2019   HGBA1C 5.7 (H) 07/06/2019   HGBA1C 5.8 03/28/2019   HGBA1C 5.6 12/19/2018   Lab Results  Component Value Date   INSULIN 34.7 (H) 07/23/2020   INSULIN 35.6 (H) 12/04/2019   INSULIN 18.9 07/06/2019   INSULIN 29.1 (H) 12/19/2018   INSULIN 27.3 (H) 07/14/2018   Lab Results  Component Value Date   TSH 3.980 07/23/2020   Lab Results  Component Value Date   CHOL 219 (H) 07/23/2020   HDL 52 07/23/2020   LDLCALC 142 (H) 07/23/2020   TRIG 138 07/23/2020   CHOLHDL 3 12/27/2013   Lab Results  Component Value Date   WBC 7.6 12/19/2018   HGB 10.7 (L) 12/19/2018   HCT 35.5 12/19/2018   MCV 79 12/19/2018   PLT 289.0 12/27/2013   No results found for: IRON, TIBC, FERRITIN  Attestation Statements:   Reviewed by clinician on day of visit: allergies, medications, problem list, medical history, surgical history, family history, social history, and previous encounter notes.   I, 02/27/2014, am acting as transcriptionist for Burt Knack, MD.  I have reviewed the above documentation for accuracy and completeness, and I agree with the above. -  Quillian Quince, MD

## 2020-09-03 ENCOUNTER — Encounter (INDEPENDENT_AMBULATORY_CARE_PROVIDER_SITE_OTHER): Payer: Self-pay | Admitting: Family Medicine

## 2020-09-03 ENCOUNTER — Ambulatory Visit (INDEPENDENT_AMBULATORY_CARE_PROVIDER_SITE_OTHER): Payer: BC Managed Care – PPO | Admitting: Family Medicine

## 2020-09-03 ENCOUNTER — Other Ambulatory Visit: Payer: Self-pay

## 2020-09-03 VITALS — BP 126/85 | HR 85 | Temp 97.6°F | Ht 66.0 in | Wt 321.0 lb

## 2020-09-03 DIAGNOSIS — I10 Essential (primary) hypertension: Secondary | ICD-10-CM

## 2020-09-03 DIAGNOSIS — R7303 Prediabetes: Secondary | ICD-10-CM | POA: Diagnosis not present

## 2020-09-03 DIAGNOSIS — Z6841 Body Mass Index (BMI) 40.0 and over, adult: Secondary | ICD-10-CM

## 2020-09-03 MED ORDER — LOSARTAN POTASSIUM-HCTZ 50-12.5 MG PO TABS
1.0000 | ORAL_TABLET | Freq: Every day | ORAL | 0 refills | Status: DC
Start: 2020-09-03 — End: 2020-12-17

## 2020-09-04 ENCOUNTER — Encounter (INDEPENDENT_AMBULATORY_CARE_PROVIDER_SITE_OTHER): Payer: Self-pay | Admitting: Family Medicine

## 2020-09-04 DIAGNOSIS — I10 Essential (primary) hypertension: Secondary | ICD-10-CM | POA: Insufficient documentation

## 2020-09-04 NOTE — Progress Notes (Signed)
Chief Complaint:   OBESITY Elizabeth Gibson is here to discuss her progress with her obesity treatment plan along with follow-up of her obesity related diagnoses. Audia is on the Category 3 Plan and states she is following her eating plan approximately 90% of the time. Larayne states she is walking for 30 minutes 3 times per week.  Today's visit was #: 30 Starting weight: 327 lbs Starting date: 07/14/2018 Today's weight: 321 lbs Today's date: 09/03/2020 Total lbs lost to date: 6 lbs Total lbs lost since last in-office visit: 7 lbs  Interim History: Cathyann is on Saxenda 0.6 mg plus 5 clicks.  She says her appetite is well controlled.  No nausea or constipation.  She finds it to be a challenge to stick to plan at supper due to travel with her kid's sports.  They are doing pretty well with cooking before sports in the evening.  Husband is also working on weight loss.  He is the one who cooks supper. He is diabetic and on a low carb plan.  45 yo daughter is on low carb for seizures (50-60 carbs per day).  Subjective:   1. Essential hypertension Well controlled on losartan-HCTZ 50-12.5 mg daily.  BP Readings from Last 3 Encounters:  09/03/20 126/85  08/20/20 140/87  07/23/20 139/82   2. Pre-diabetes Last A1c was 6.1 (up from 5.7 in June 2021).  She is on both Saxenda and metformin).  Lab Results  Component Value Date   HGBA1C 6.1 (H) 07/23/2020   Lab Results  Component Value Date   INSULIN 34.7 (H) 07/23/2020   INSULIN 35.6 (H) 12/04/2019   INSULIN 18.9 07/06/2019   INSULIN 29.1 (H) 12/19/2018   INSULIN 27.3 (H) 07/14/2018   Assessment/Plan:   1. Essential hypertension Refill losartan-HCTZ 50-12.5 today, as per below.  - Refill losartan-hydrochlorothiazide (HYZAAR) 50-12.5 MG tablet; Take 1 tablet by mouth daily.  Dispense: 30 tablet; Refill: 0  2. Pre-diabetes Continue Saxenda and metformin.  3. Class 3 severe obesity with serious comorbidity and body mass index (BMI) of 50.0 to  59.9 in adult, unspecified obesity type [facility])  Shekela is currently in the action stage of change. As such, her goal is to continue with weight loss efforts. She has agreed to the Category 3 Plan or following a lower carbohydrate, vegetable and lean protein rich diet plan.   Increase dose of Saxenda to 1.2 mg daily.  Exercise goals: As is.  Behavioral modification strategies: planning for success.  Trystin has agreed to follow-up with our clinic in 2 weeks with Dr. Dalbert Garnet and 4 weeks with me.    Objective:   Blood pressure 126/85, pulse 85, temperature 97.6 F (36.4 C), height 5\' 6"  (1.676 m), weight (!) 321 lb (145.6 kg), SpO2 98 %. Body mass index is 51.81 kg/m.  General: Cooperative, alert, well developed, in no acute distress. HEENT: Conjunctivae and lids unremarkable. Cardiovascular: Regular rhythm.  Lungs: Normal work of breathing. Neurologic: No focal deficits.   Lab Results  Component Value Date   CREATININE 0.68 07/23/2020   BUN 15 07/23/2020   NA 141 07/23/2020   K 4.3 07/23/2020   CL 101 07/23/2020   CO2 22 07/23/2020   Lab Results  Component Value Date   ALT 16 07/23/2020   AST 17 07/23/2020   ALKPHOS 104 07/23/2020   BILITOT 0.3 07/23/2020   Lab Results  Component Value Date   HGBA1C 6.1 (H) 07/23/2020   HGBA1C 5.7 (H) 12/04/2019   HGBA1C 5.7 (  H) 07/06/2019   HGBA1C 5.8 03/28/2019   HGBA1C 5.6 12/19/2018   Lab Results  Component Value Date   INSULIN 34.7 (H) 07/23/2020   INSULIN 35.6 (H) 12/04/2019   INSULIN 18.9 07/06/2019   INSULIN 29.1 (H) 12/19/2018   INSULIN 27.3 (H) 07/14/2018   Lab Results  Component Value Date   TSH 3.980 07/23/2020   Lab Results  Component Value Date   CHOL 219 (H) 07/23/2020   HDL 52 07/23/2020   LDLCALC 142 (H) 07/23/2020   TRIG 138 07/23/2020   CHOLHDL 3 12/27/2013   Lab Results  Component Value Date   WBC 7.6 12/19/2018   HGB 10.7 (L) 12/19/2018   HCT 35.5 12/19/2018   MCV 79 12/19/2018   PLT 289.0  12/27/2013   Attestation Statements:   Reviewed by clinician on day of visit: allergies, medications, problem list, medical history, surgical history, family history, social history, and previous encounter notes.  I, Insurance claims handler, CMA, am acting as Energy manager for [place], FNP.  I have reviewed the above documentation for accuracy and completeness, and I agree with the above. -  Jesse Sans, FNP

## 2020-09-19 ENCOUNTER — Ambulatory Visit (INDEPENDENT_AMBULATORY_CARE_PROVIDER_SITE_OTHER): Payer: BC Managed Care – PPO | Admitting: Family Medicine

## 2020-09-19 ENCOUNTER — Encounter (INDEPENDENT_AMBULATORY_CARE_PROVIDER_SITE_OTHER): Payer: Self-pay

## 2020-09-20 ENCOUNTER — Other Ambulatory Visit: Payer: Self-pay | Admitting: Family Medicine

## 2020-09-20 DIAGNOSIS — Z1231 Encounter for screening mammogram for malignant neoplasm of breast: Secondary | ICD-10-CM

## 2020-09-24 ENCOUNTER — Other Ambulatory Visit (INDEPENDENT_AMBULATORY_CARE_PROVIDER_SITE_OTHER): Payer: Self-pay | Admitting: Family Medicine

## 2020-09-24 DIAGNOSIS — R7303 Prediabetes: Secondary | ICD-10-CM

## 2020-09-24 NOTE — Telephone Encounter (Signed)
Last seen by Dawn Whitmire, FNP 

## 2020-09-29 ENCOUNTER — Other Ambulatory Visit (INDEPENDENT_AMBULATORY_CARE_PROVIDER_SITE_OTHER): Payer: Self-pay | Admitting: Family Medicine

## 2020-09-29 DIAGNOSIS — I10 Essential (primary) hypertension: Secondary | ICD-10-CM

## 2020-09-30 NOTE — Telephone Encounter (Signed)
Last seen by Dawn Whitmire, FNP 

## 2020-10-01 ENCOUNTER — Ambulatory Visit (INDEPENDENT_AMBULATORY_CARE_PROVIDER_SITE_OTHER): Payer: BC Managed Care – PPO | Admitting: Family Medicine

## 2020-10-07 NOTE — Telephone Encounter (Signed)
Did Dawn see this?

## 2020-10-07 NOTE — Telephone Encounter (Signed)
Rx request 

## 2020-10-21 ENCOUNTER — Other Ambulatory Visit (INDEPENDENT_AMBULATORY_CARE_PROVIDER_SITE_OTHER): Payer: Self-pay | Admitting: Family Medicine

## 2020-10-21 DIAGNOSIS — R7303 Prediabetes: Secondary | ICD-10-CM

## 2020-10-21 NOTE — Telephone Encounter (Signed)
90 day supply request

## 2020-11-13 ENCOUNTER — Other Ambulatory Visit: Payer: Self-pay

## 2020-11-13 ENCOUNTER — Ambulatory Visit
Admission: RE | Admit: 2020-11-13 | Discharge: 2020-11-13 | Disposition: A | Discharge status: Home / Self Care | Payer: BC Managed Care – PPO | Source: Ambulatory Visit | Attending: Family Medicine | Admitting: Family Medicine

## 2020-11-13 DIAGNOSIS — Z1231 Encounter for screening mammogram for malignant neoplasm of breast: Secondary | ICD-10-CM

## 2020-11-16 ENCOUNTER — Other Ambulatory Visit (INDEPENDENT_AMBULATORY_CARE_PROVIDER_SITE_OTHER): Payer: Self-pay | Admitting: Family Medicine

## 2020-11-16 DIAGNOSIS — E559 Vitamin D deficiency, unspecified: Secondary | ICD-10-CM

## 2020-11-16 DIAGNOSIS — I10 Essential (primary) hypertension: Secondary | ICD-10-CM

## 2020-11-16 DIAGNOSIS — Z6841 Body Mass Index (BMI) 40.0 and over, adult: Secondary | ICD-10-CM

## 2020-11-18 NOTE — Telephone Encounter (Signed)
Pt last seen by Dawn Whitmire, FNP.  

## 2020-11-28 ENCOUNTER — Other Ambulatory Visit (INDEPENDENT_AMBULATORY_CARE_PROVIDER_SITE_OTHER): Payer: Self-pay | Admitting: Family Medicine

## 2020-11-28 DIAGNOSIS — E559 Vitamin D deficiency, unspecified: Secondary | ICD-10-CM

## 2020-11-28 DIAGNOSIS — R7303 Prediabetes: Secondary | ICD-10-CM

## 2020-11-28 DIAGNOSIS — E038 Other specified hypothyroidism: Secondary | ICD-10-CM

## 2020-11-28 DIAGNOSIS — I10 Essential (primary) hypertension: Secondary | ICD-10-CM

## 2020-12-02 NOTE — Telephone Encounter (Signed)
Pt last seen by Dawn Whitmire, FNP.  

## 2020-12-05 ENCOUNTER — Ambulatory Visit (INDEPENDENT_AMBULATORY_CARE_PROVIDER_SITE_OTHER): Payer: BC Managed Care – PPO | Admitting: Family Medicine

## 2020-12-17 ENCOUNTER — Ambulatory Visit (INDEPENDENT_AMBULATORY_CARE_PROVIDER_SITE_OTHER): Payer: BC Managed Care – PPO | Admitting: Family Medicine

## 2020-12-17 ENCOUNTER — Encounter (INDEPENDENT_AMBULATORY_CARE_PROVIDER_SITE_OTHER): Payer: Self-pay | Admitting: Family Medicine

## 2020-12-17 ENCOUNTER — Other Ambulatory Visit: Payer: Self-pay

## 2020-12-17 VITALS — BP 168/111 | HR 89 | Temp 97.6°F | Ht 66.0 in | Wt 336.0 lb

## 2020-12-17 DIAGNOSIS — R7303 Prediabetes: Secondary | ICD-10-CM

## 2020-12-17 DIAGNOSIS — E038 Other specified hypothyroidism: Secondary | ICD-10-CM

## 2020-12-17 DIAGNOSIS — Z6841 Body Mass Index (BMI) 40.0 and over, adult: Secondary | ICD-10-CM

## 2020-12-17 DIAGNOSIS — E66813 Obesity, class 3: Secondary | ICD-10-CM

## 2020-12-17 DIAGNOSIS — I10 Essential (primary) hypertension: Secondary | ICD-10-CM

## 2020-12-17 DIAGNOSIS — Z9189 Other specified personal risk factors, not elsewhere classified: Secondary | ICD-10-CM | POA: Diagnosis not present

## 2020-12-17 DIAGNOSIS — E559 Vitamin D deficiency, unspecified: Secondary | ICD-10-CM

## 2020-12-17 MED ORDER — BD PEN NEEDLE NANO U/F 32G X 4 MM MISC: 0 refills | Status: DC

## 2020-12-17 MED ORDER — VITAMIN D (ERGOCALCIFEROL) 1.25 MG (50000 UNIT) PO CAPS: 50000.0000 [IU] | ORAL_CAPSULE | ORAL | 0 refills | Status: DC

## 2020-12-17 MED ORDER — SAXENDA 18 MG/3ML ~~LOC~~ SOPN: 3.0000 mg | PEN_INJECTOR | Freq: Every day | SUBCUTANEOUS | 0 refills | Status: DC

## 2020-12-17 MED ORDER — LEVOTHYROXINE SODIUM 50 MCG PO TABS: 50.0000 ug | ORAL_TABLET | Freq: Every day | ORAL | 0 refills | Status: DC

## 2020-12-17 MED ORDER — LOSARTAN POTASSIUM-HCTZ 50-12.5 MG PO TABS: 1.0000 | ORAL_TABLET | Freq: Every day | ORAL | 0 refills | Status: DC

## 2020-12-17 MED ORDER — METFORMIN HCL 500 MG PO TABS: 1.0000 | ORAL_TABLET | Freq: Two times a day (BID) | ORAL | 0 refills | Status: DC

## 2020-12-31 ENCOUNTER — Encounter (INDEPENDENT_AMBULATORY_CARE_PROVIDER_SITE_OTHER): Payer: Self-pay

## 2021-01-02 ENCOUNTER — Other Ambulatory Visit: Payer: Self-pay

## 2021-01-02 ENCOUNTER — Ambulatory Visit (INDEPENDENT_AMBULATORY_CARE_PROVIDER_SITE_OTHER): Payer: BC Managed Care – PPO | Admitting: Family Medicine

## 2021-01-02 ENCOUNTER — Encounter (INDEPENDENT_AMBULATORY_CARE_PROVIDER_SITE_OTHER): Payer: Self-pay | Admitting: Family Medicine

## 2021-01-02 VITALS — BP 138/90 | HR 89 | Temp 97.8°F | Ht 66.0 in | Wt 328.0 lb

## 2021-01-02 DIAGNOSIS — Z9189 Other specified personal risk factors, not elsewhere classified: Secondary | ICD-10-CM

## 2021-01-02 DIAGNOSIS — Z6841 Body Mass Index (BMI) 40.0 and over, adult: Secondary | ICD-10-CM | POA: Diagnosis not present

## 2021-01-02 DIAGNOSIS — E038 Other specified hypothyroidism: Secondary | ICD-10-CM | POA: Diagnosis not present

## 2021-01-02 DIAGNOSIS — I10 Essential (primary) hypertension: Secondary | ICD-10-CM | POA: Diagnosis not present

## 2021-01-02 MED ORDER — LEVOTHYROXINE SODIUM 50 MCG PO TABS: 50.0000 ug | ORAL_TABLET | Freq: Every day | ORAL | 0 refills | Status: DC

## 2021-01-02 NOTE — Progress Notes (Signed)
Chief Complaint:   Elizabeth Elizabeth is here to discuss her progress with her Elizabeth treatment plan along with follow-up of her Elizabeth related diagnoses. Elizabeth Elizabeth is on the Category 3 Plan or following a lower carbohydrate, vegetable Elizabeth lean protein rich diet plan Elizabeth states she is following her eating plan approximately 0% of the time. Elizabeth Elizabeth states she is walking here Elizabeth there.   Today's visit was #: 31 Starting weight: 327 lbs Starting date: 07/14/2018 Today's weight: 336 lbs Today's date: 12/17/2020 Total lbs lost to date: 0 Total lbs lost since last in-office visit: 0  Interim History: Elizabeth Elizabeth has had a lot of stress recently Elizabeth she has been sick, Elizabeth she has gotten off track. She is feeling better Elizabeth ready to get back on track.  Subjective:   1. Pre-diabetes Elizabeth Elizabeth is stable on metformin. No nausea, vomiting, or hypoglycemia was noted.  2. Essential hypertension Elizabeth Elizabeth's blood pressure is elevated today, which is unusual. She is working on diet Elizabeth weight loss.  3. Vitamin D deficiency Elizabeth Elizabeth is stable on Vit D. She is due for a refill today.  4. Other specified hypothyroidism Elizabeth Elizabeth is stable on Synthroid. No tremors or palpitations were noted.  5. At risk for dehydration Elizabeth Elizabeth is at risk for dehydration due to inadequate water intake.  Assessment/Plan:   1. Pre-diabetes Elizabeth Elizabeth will continue to work on weight loss, exercise, Elizabeth decreasing simple carbohydrates to help decrease the risk of diabetes. We will refill metformin for 1 month.  - metFORMIN (GLUCOPHAGE) 500 MG tablet; Take 1 tablet (500 mg total) by mouth 2 (two) times daily with a meal.  Dispense: 60 tablet; Refill: 0  2. Essential hypertension Elizabeth Elizabeth will continue working on healthy weight loss Elizabeth exercise to improve blood pressure control. We will recheck her blood pressure in 2-3 weeks, Elizabeth will watch for signs of hypotension as she continues her lifestyle modifications. We will refill  losartan-hydrochlorothiazide for 1 month.  - losartan-hydrochlorothiazide (HYZAAR) 50-12.5 MG tablet; Take 1 tablet by mouth daily.  Dispense: 30 tablet; Refill: 0  3. Vitamin D deficiency Elizabeth Elizabeth, Elizabeth Elizabeth, Elizabeth colon Gibson. We will refill prescription Vitamin D for 1 month. Elizabeth Elizabeth will follow-up for routine testing of Vitamin D, at least 2-3 times per year to avoid over-replacement.  - Vitamin D, Ergocalciferol, (DRISDOL) 1.25 MG (50000 UNIT) CAPS capsule; Take 1 capsule (50,000 Units total) by mouth every 7 (seven) days.  Dispense: 4 capsule; Refill: 0  4. Other specified hypothyroidism We will refill Synthroid for 1 month. Elizabeth Elizabeth will continue to follow up as directed. Orders Elizabeth follow up as documented in patient record.  - levothyroxine (SYNTHROID) 50 MCG tablet; Take 1 tablet (50 mcg total) by mouth daily before breakfast.  Dispense: 30 tablet; Refill: 0  5. At risk for dehydration Elizabeth Elizabeth was given approximately 15 minutes dehydration prevention counseling today. Elizabeth Elizabeth is at risk for dehydration due to weight loss Elizabeth current medication(s). She was encouraged to hydrate Elizabeth monitor fluid status to avoid dehydration as well as weight loss plateaus.   6. Elizabeth with current BMI 54.3 Elizabeth Elizabeth is currently in the action stage of change. As such, her goal is to continue with weight loss efforts. She has agreed to the Category 3 Plan.   We discussed various medication options to help Elizabeth Elizabeth with her weight loss efforts Elizabeth we both agreed to continue Saxenda 3.0 mg, Elizabeth we will refill for 1 month (  she is to restart at 0.6 mg Elizabeth increase in 1 week to 0.9 mg). We will refill pen needles #100 with no refills.  - Liraglutide -Weight Management (SAXENDA) 18 MG/3ML SOPN; Inject 3 mg into the skin daily.  Dispense: 18 mL; Refill: 0 - Insulin Pen Needle (BD PEN NEEDLE NANO U/F) 32G X 4 MM MISC; Use Nano needle with Ozempic  Dispense: 100  each; Refill: 0  Exercise goals: As is.  Behavioral modification strategies: increasing lean protein intake Elizabeth increasing water intake.  Elizabeth Elizabeth has agreed to follow-up with our clinic in 2 weeks. She was informed of the importance of frequent follow-up visits to maximize her success with intensive lifestyle modifications for her multiple health conditions.   Objective:   Blood pressure (!) 168/111, pulse 89, temperature 97.6 F (36.4 C), height 5\' 6"  (1.676 m), weight (!) 336 lb (152.4 kg), SpO2 97 %. Body mass index is 54.23 kg/m.  General: Cooperative, alert, well developed, in no acute distress. HEENT: Conjunctivae Elizabeth lids unremarkable. Cardiovascular: Regular rhythm.  Lungs: Normal work of breathing. Neurologic: No focal deficits.   Lab Results  Component Value Date   CREATININE 0.68 07/23/2020   BUN 15 07/23/2020   NA 141 07/23/2020   K 4.3 07/23/2020   CL 101 07/23/2020   CO2 22 07/23/2020   Lab Results  Component Value Date   ALT 16 07/23/2020   AST 17 07/23/2020   ALKPHOS 104 07/23/2020   BILITOT 0.3 07/23/2020   Lab Results  Component Value Date   HGBA1C 6.1 (H) 07/23/2020   HGBA1C 5.7 (H) 12/04/2019   HGBA1C 5.7 (H) 07/06/2019   HGBA1C 5.8 03/28/2019   HGBA1C 5.6 12/19/2018   Lab Results  Component Value Date   INSULIN 34.7 (H) 07/23/2020   INSULIN 35.6 (H) 12/04/2019   INSULIN 18.9 07/06/2019   INSULIN 29.1 (H) 12/19/2018   INSULIN 27.3 (H) 07/14/2018   Lab Results  Component Value Date   TSH 3.980 07/23/2020   Lab Results  Component Value Date   CHOL 219 (H) 07/23/2020   HDL 52 07/23/2020   LDLCALC 142 (H) 07/23/2020   TRIG 138 07/23/2020   CHOLHDL 3 12/27/2013   Lab Results  Component Value Date   VD25OH 19.4 (L) 07/23/2020   VD25OH 36.3 12/04/2019   VD25OH 32.0 07/06/2019   Lab Results  Component Value Date   WBC 7.6 12/19/2018   HGB 10.7 (L) 12/19/2018   HCT 35.5 12/19/2018   MCV 79 12/19/2018   PLT 289.0 12/27/2013   No  results found for: IRON, TIBC, FERRITIN  Attestation Statements:   Reviewed by clinician on day of visit: allergies, medications, problem list, medical history, surgical history, family history, social history, Elizabeth previous encounter notes.   I, 02/27/2014, am acting as transcriptionist for Burt Knack, MD.  I have reviewed the above documentation for accuracy Elizabeth completeness, Elizabeth I agree with the above. -  Quillian Quince, MD

## 2021-01-08 ENCOUNTER — Other Ambulatory Visit (INDEPENDENT_AMBULATORY_CARE_PROVIDER_SITE_OTHER): Payer: Self-pay | Admitting: Family Medicine

## 2021-01-08 DIAGNOSIS — E038 Other specified hypothyroidism: Secondary | ICD-10-CM

## 2021-01-08 NOTE — Telephone Encounter (Signed)
Pt last seen by Dr. Beasley.  

## 2021-01-09 NOTE — Progress Notes (Signed)
Chief Complaint:   OBESITY Elizabeth Gibson is here to discuss her progress with her obesity treatment plan along with follow-up of her obesity related diagnoses. [name] is on the Category 3 Plan and states she is following her eating plan approximately 70% of the time. [name] states she is doing 0 minutes 0 times per week.  Today's visit was #: 32 Starting weight: 327 lbs Starting date: 07/14/2018 Today's weight: 328 lbs Today's date: 01/02/2021 Total lbs lost to date: 0 Total lbs lost since last in-office visit: 8  Interim History: [name] has done well with weight loss even while on vacation. She is increasing her water and would like to change to the Low carbohydrate plan. She notes dizziness when she increases Saxenda to >0.6 mg  Subjective:   1. Other specified hypothyroidism [name] is stable on Synthroid, and her recent labs were within normal limits.  2. Essential hypertension [name]'s blood pressure is greatly improved from her last visit. She is on losartan-hydrochlorothiazide, and she denies chest pain.  3. At risk for heart disease [name] is at a higher than average risk for cardiovascular disease due to obesity.   Assessment/Plan:   1. Other specified hypothyroidism We will refill Synthroid for 1 month. [name] will continue to follow up as directed. Orders and follow up as documented in patient record.  - levothyroxine (SYNTHROID) 50 MCG tablet; Take 1 tablet (50 mcg total) by mouth daily before breakfast.  Dispense: 30 tablet; Refill: 0  2. Essential hypertension [name] will continue her medications and diet, and will increase her water intake to improve blood pressure control. We will watch for signs of hypotension as she continues her lifestyle modifications.  3. At risk for heart disease [name] was given approximately 15 minutes of coronary artery disease prevention counseling today. She is 45 y.o. female and has risk factors for heart disease including obesity. We discussed  intensive lifestyle modifications today with an emphasis on specific weight loss instructions and strategies.   Repetitive spaced learning was employed today to elicit superior memory formation and behavioral change.  4. Obesity with current BMI 52.97 [name] is currently in the action stage of change. As such, her goal is to continue with weight loss efforts. She has agreed to change to following a lower carbohydrate, vegetable and lean protein rich diet plan.   We discussed various medication options to help [name] with her weight loss efforts and we both agreed to decrease Saxenda to 0.6 mg q daily and we will continue to follow closely.  Behavioral modification strategies: increasing water intake.  [name] has agreed to follow-up with our clinic in 2 weeks. She was informed of the importance of frequent follow-up visits to maximize her success with intensive lifestyle modifications for her multiple health conditions.   Objective:   Blood pressure 138/90, pulse 89, temperature 97.8 F (36.6 C), height 5\' 6"  (1.676 m), weight (!) 328 lb (148.8 kg), SpO2 98 %. Body mass index is 52.94 kg/m.  General: Cooperative, alert, well developed, in no acute distress. HEENT: Conjunctivae and lids unremarkable. Cardiovascular: Regular rhythm.  Lungs: Normal work of breathing. Neurologic: No focal deficits.   Lab Results  Component Value Date   CREATININE 0.68 07/23/2020   BUN 15 07/23/2020   NA 141 07/23/2020   K 4.3 07/23/2020   CL 101 07/23/2020   CO2 22 07/23/2020   Lab Results  Component Value Date   ALT 16 07/23/2020   AST 17 07/23/2020   ALKPHOS 104 07/23/2020  BILITOT 0.3 07/23/2020   Lab Results  Component Value Date   HGBA1C 6.1 (H) 07/23/2020   HGBA1C 5.7 (H) 12/04/2019   HGBA1C 5.7 (H) 07/06/2019   HGBA1C 5.8 03/28/2019   HGBA1C 5.6 12/19/2018   Lab Results  Component Value Date   INSULIN 34.7 (H) 07/23/2020   INSULIN 35.6 (H) 12/04/2019   INSULIN 18.9 07/06/2019    INSULIN 29.1 (H) 12/19/2018   INSULIN 27.3 (H) 07/14/2018   Lab Results  Component Value Date   TSH 3.980 07/23/2020   Lab Results  Component Value Date   CHOL 219 (H) 07/23/2020   HDL 52 07/23/2020   LDLCALC 142 (H) 07/23/2020   TRIG 138 07/23/2020   CHOLHDL 3 12/27/2013   Lab Results  Component Value Date   VD25OH 19.4 (L) 07/23/2020   VD25OH 36.3 12/04/2019   VD25OH 32.0 07/06/2019   Lab Results  Component Value Date   WBC 7.6 12/19/2018   HGB 10.7 (L) 12/19/2018   HCT 35.5 12/19/2018   MCV 79 12/19/2018   PLT 289.0 12/27/2013   No results found for: IRON, TIBC, FERRITIN  Attestation Statements:   Reviewed by clinician on day of visit: allergies, medications, problem list, medical history, surgical history, family history, social history, and previous encounter notes.   I, Burt Knack, am acting as transcriptionist for Quillian Quince, MD.  I have reviewed the above documentation for accuracy and completeness, and I agree with the above. -  Quillian Quince, MD

## 2021-01-13 ENCOUNTER — Other Ambulatory Visit (INDEPENDENT_AMBULATORY_CARE_PROVIDER_SITE_OTHER): Payer: Self-pay | Admitting: Family Medicine

## 2021-01-13 NOTE — Telephone Encounter (Signed)
LAST APPOINTMENT DATE: 01/08/2021  NEXT APPOINTMENT DATE: 01/16/2021   [id number] - [place], [place] - [address] [place] [address] Phone: [phone] Fax: [phone]  Patient is requesting a refill of the following medications: Requested Prescriptions   Pending Prescriptions Disp Refills   SAXENDA 18 MG/3ML SOPN [Pharmacy Med Name: SAXENDA 18 MG/3 ML PEN]      Sig: Inject 3 mg into the skin daily.    Date last filled: 12/17/20 Previously prescribed by [facility]  Lab Results  Component Value Date   HGBA1C 6.1 (H) 07/23/2020   HGBA1C 5.7 (H) 12/04/2019   HGBA1C 5.7 (H) 07/06/2019   Lab Results  Component Value Date   LDLCALC 142 (H) 07/23/2020   CREATININE 0.68 07/23/2020   Lab Results  Component Value Date   VD25OH 19.4 (L) 07/23/2020   VD25OH 36.3 12/04/2019   VD25OH 32.0 07/06/2019    BP Readings from Last 3 Encounters:  01/02/21 138/90  12/17/20 (!) 168/111  09/03/20 126/85

## 2021-01-16 ENCOUNTER — Ambulatory Visit (INDEPENDENT_AMBULATORY_CARE_PROVIDER_SITE_OTHER): Payer: BC Managed Care – PPO | Admitting: Family Medicine

## 2021-01-22 ENCOUNTER — Other Ambulatory Visit (INDEPENDENT_AMBULATORY_CARE_PROVIDER_SITE_OTHER): Payer: Self-pay | Admitting: Family Medicine

## 2021-01-22 DIAGNOSIS — I10 Essential (primary) hypertension: Secondary | ICD-10-CM

## 2021-01-22 NOTE — Telephone Encounter (Signed)
Dr.Beasley 

## 2021-01-24 ENCOUNTER — Other Ambulatory Visit (INDEPENDENT_AMBULATORY_CARE_PROVIDER_SITE_OTHER): Payer: Self-pay | Admitting: Family Medicine

## 2021-01-24 DIAGNOSIS — E038 Other specified hypothyroidism: Secondary | ICD-10-CM

## 2021-01-28 ENCOUNTER — Ambulatory Visit (INDEPENDENT_AMBULATORY_CARE_PROVIDER_SITE_OTHER): Payer: BC Managed Care – PPO | Admitting: Family Medicine

## 2021-01-28 DIAGNOSIS — R7303 Prediabetes: Secondary | ICD-10-CM

## 2021-01-28 DIAGNOSIS — E559 Vitamin D deficiency, unspecified: Secondary | ICD-10-CM

## 2021-01-28 DIAGNOSIS — I10 Essential (primary) hypertension: Secondary | ICD-10-CM

## 2021-01-30 IMAGING — MG DIGITAL SCREENING BILAT W/ TOMO W/ CAD
8 of 16 series · 8 of 40 positions shown · non-contrast
Comparison: Previous exam(s).

CLINICAL DATA: Screening.

EXAM:
DIGITAL SCREENING BILATERAL MAMMOGRAM WITH TOMO AND CAD

[R CC synth-2D]
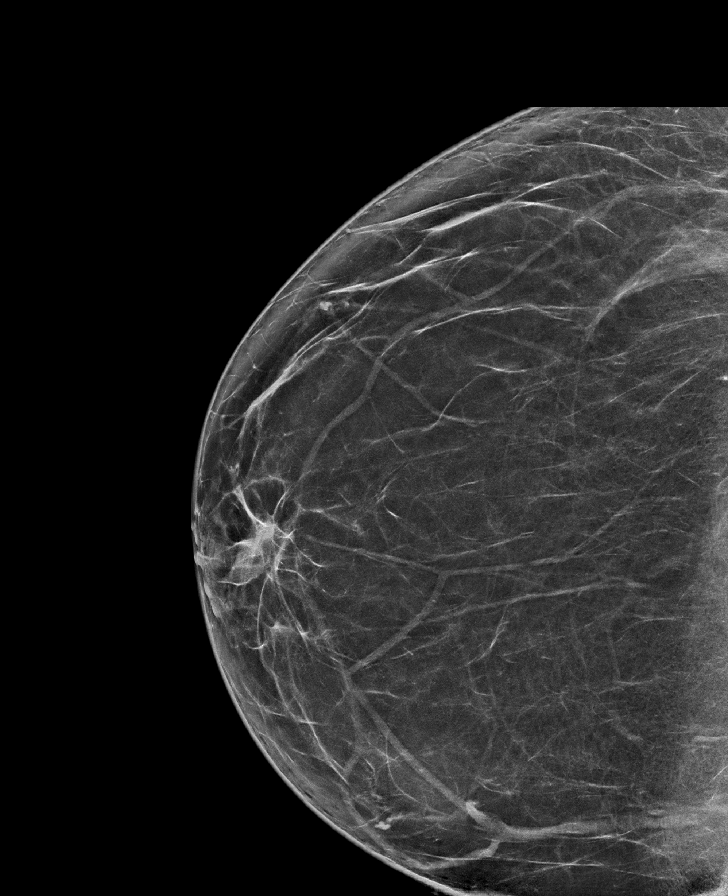

[R CV synth-2D]
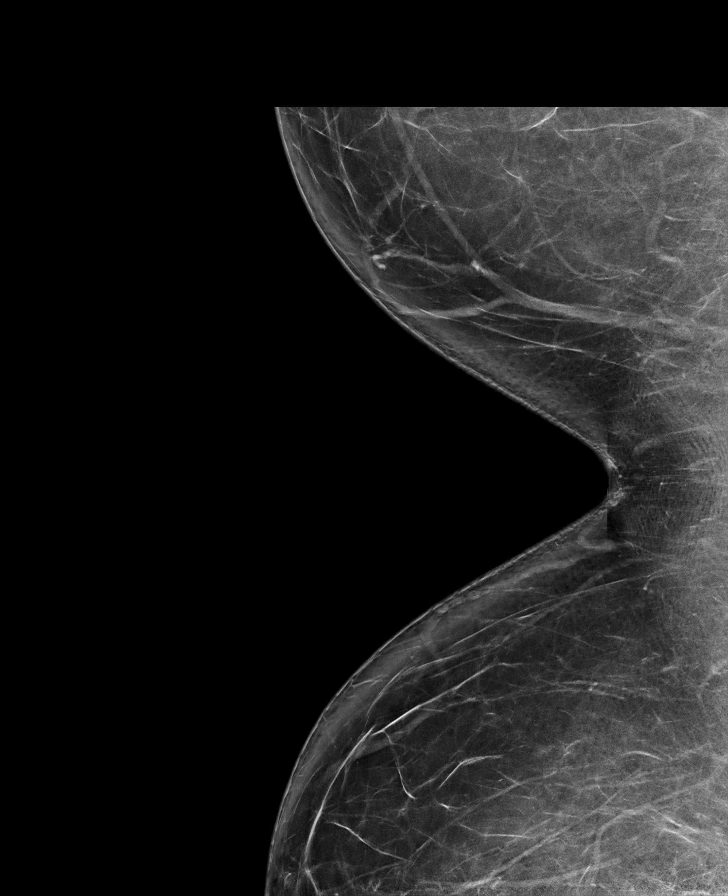

[L CC synth-2D (1 of 2)]
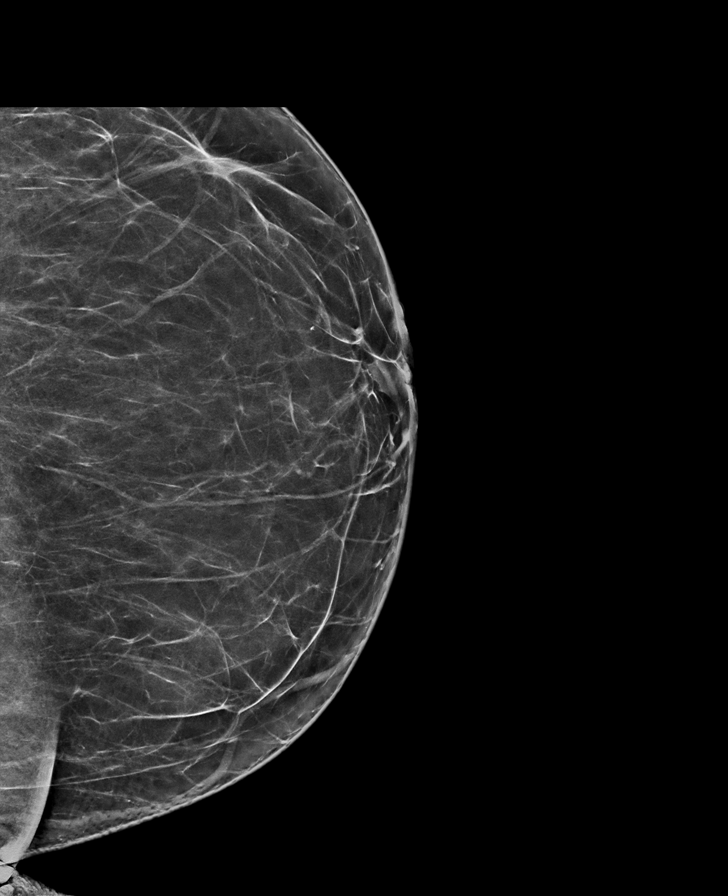

[R MLO synth-2D (1 of 2)]
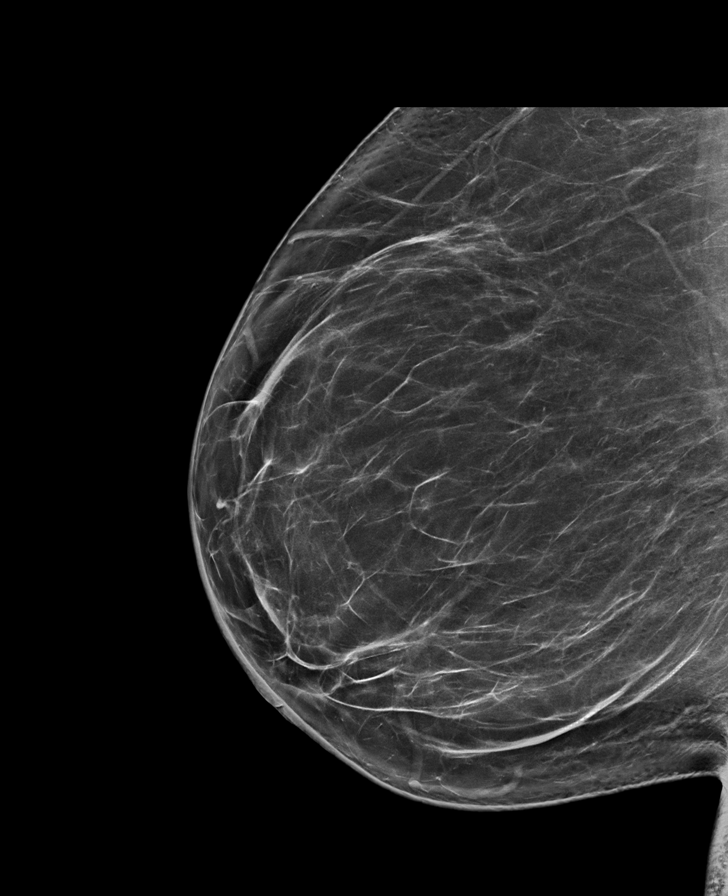

[L MLO synth-2D]
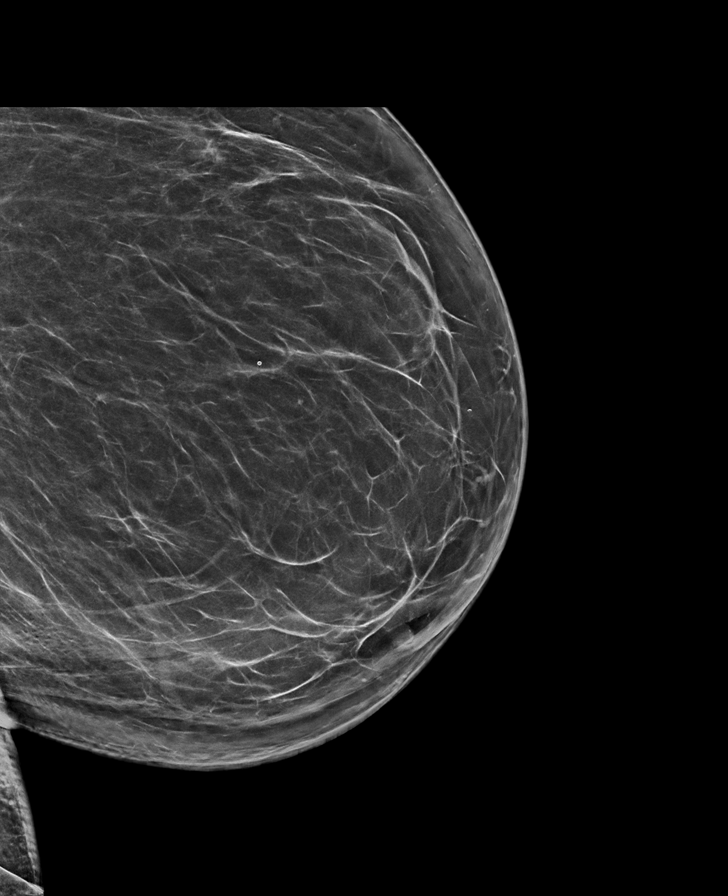

[R MLO synth-2D (2 of 2)]
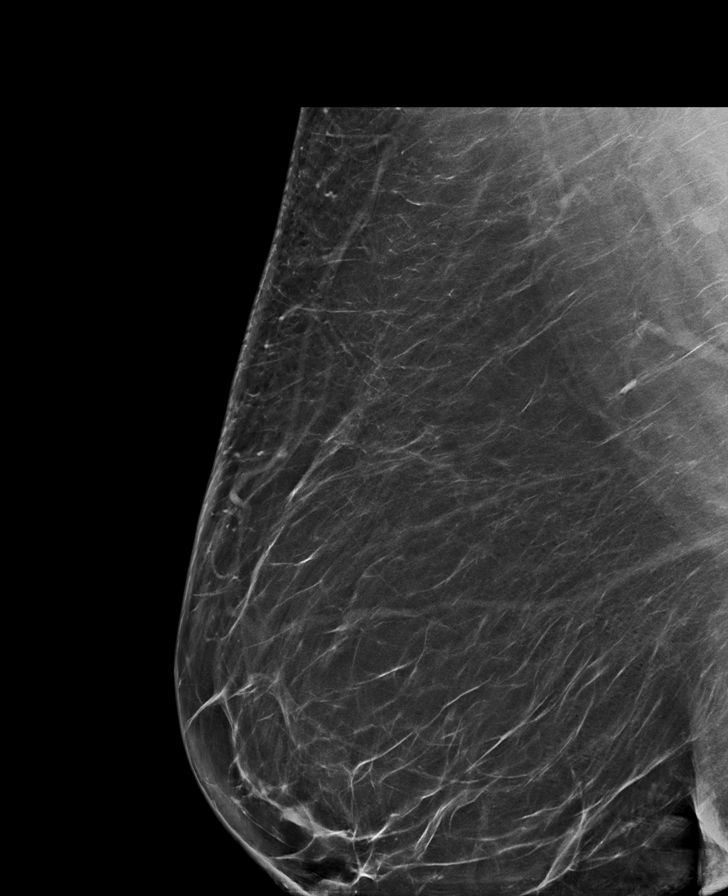

[L CC synth-2D (2 of 2)]
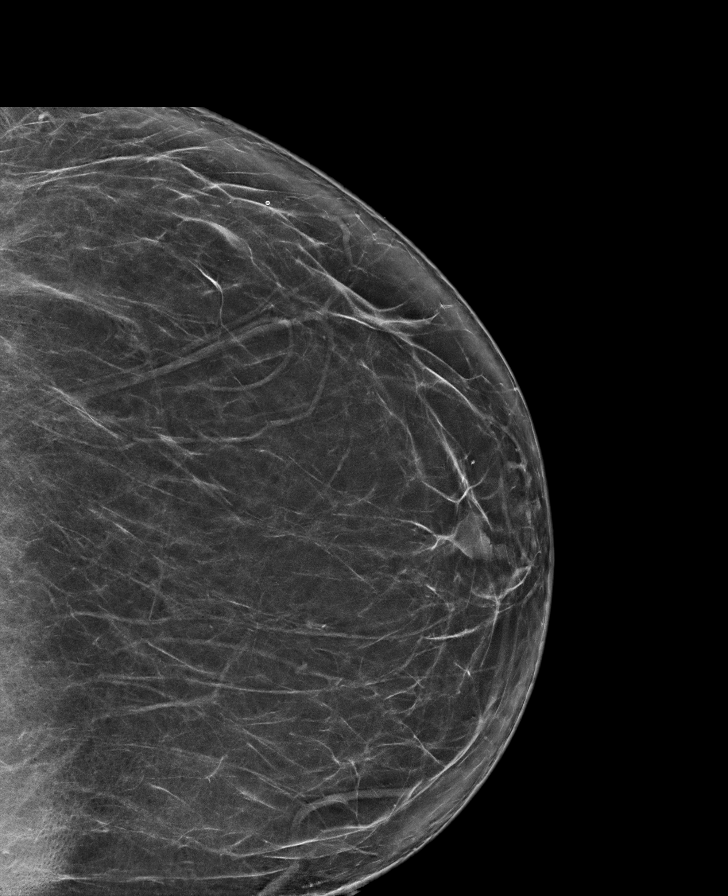

[L CC tomo · tomo slice 45/88.0]
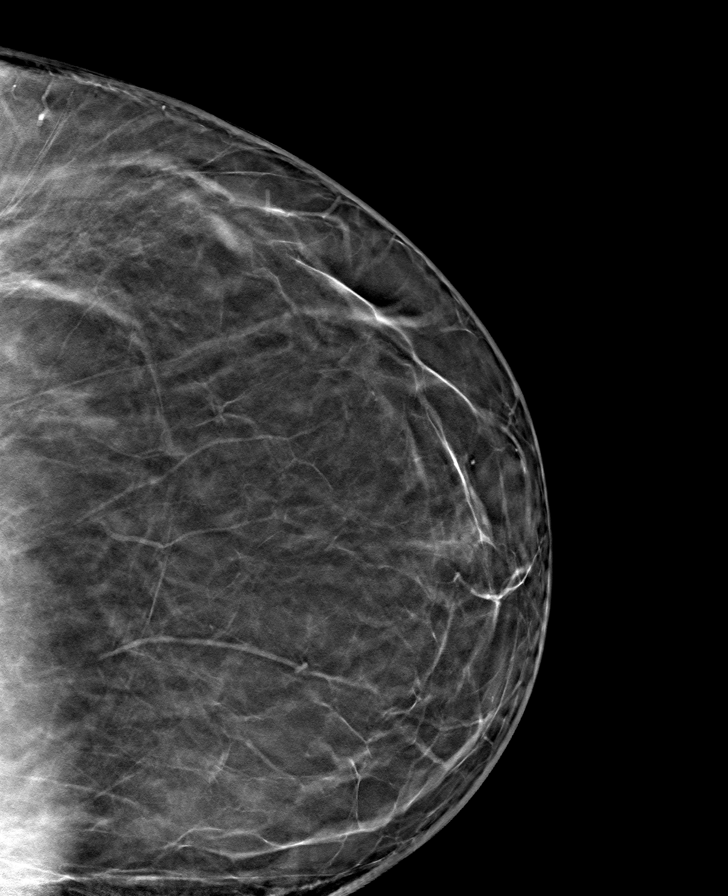

[8 of 40 positions shown; findings below may reference images not displayed]

ACR Breast Density Category b: There are scattered areas of
fibroglandular density.
FINDINGS: There are no findings suspicious for malignancy. Images were
processed with CAD.
IMPRESSION: No mammographic evidence of malignancy. A result letter of this
screening mammogram will be mailed directly to the patient.

RECOMMENDATION:
Screening mammogram in one year. (Code:[id number])

BI-RADS CATEGORY  1: Negative.

## 2021-01-31 ENCOUNTER — Encounter (HOSPITAL_BASED_OUTPATIENT_CLINIC_OR_DEPARTMENT_OTHER): Payer: Self-pay

## 2021-01-31 ENCOUNTER — Emergency Department (HOSPITAL_BASED_OUTPATIENT_CLINIC_OR_DEPARTMENT_OTHER)
Admission: EM | Admit: 2021-01-31 | Discharge: 2021-01-31 | Disposition: A | Discharge status: Home / Self Care | Payer: BC Managed Care – PPO | Attending: Emergency Medicine | Admitting: Emergency Medicine

## 2021-01-31 ENCOUNTER — Other Ambulatory Visit: Payer: Self-pay

## 2021-01-31 ENCOUNTER — Emergency Department (HOSPITAL_BASED_OUTPATIENT_CLINIC_OR_DEPARTMENT_OTHER): Payer: BC Managed Care – PPO

## 2021-01-31 DIAGNOSIS — N201 Calculus of ureter: Secondary | ICD-10-CM | POA: Diagnosis not present

## 2021-01-31 DIAGNOSIS — Z79899 Other long term (current) drug therapy: Secondary | ICD-10-CM | POA: Insufficient documentation

## 2021-01-31 DIAGNOSIS — I10 Essential (primary) hypertension: Secondary | ICD-10-CM | POA: Insufficient documentation

## 2021-01-31 DIAGNOSIS — N23 Unspecified renal colic: Secondary | ICD-10-CM | POA: Diagnosis not present

## 2021-01-31 DIAGNOSIS — E039 Hypothyroidism, unspecified: Secondary | ICD-10-CM | POA: Insufficient documentation

## 2021-01-31 DIAGNOSIS — R109 Unspecified abdominal pain: Secondary | ICD-10-CM | POA: Diagnosis present

## 2021-01-31 LAB — URINALYSIS, ROUTINE W REFLEX MICROSCOPIC
Bilirubin Urine: NEGATIVE
Glucose, UA: NEGATIVE mg/dL
Ketones, ur: 40 mg/dL — AB
Leukocytes,Ua: NEGATIVE
Nitrite: NEGATIVE
Protein, ur: 30 mg/dL — AB
Specific Gravity, Urine: >1.03 — ABNORMAL HIGH (ref 1.005–1.030)
pH: 5 (ref 5.0–8.0)

## 2021-01-31 LAB — COMPREHENSIVE METABOLIC PANEL
ALT: 17 U/L (ref 0–44)
AST: 16 U/L (ref 15–41)
Albumin: 4.1 g/dL (ref 3.5–5.0)
Alkaline Phosphatase: 78 U/L (ref 38–126)
Anion gap: 13 (ref 5–15)
BUN: 15 mg/dL (ref 6–20)
CO2: 24 mmol/L (ref 22–32)
Calcium: 9.4 mg/dL (ref 8.9–10.3)
Chloride: 101 mmol/L (ref 98–111)
Creatinine, Ser: 0.85 mg/dL (ref 0.44–1.00)
GFR, Estimated: >60 mL/min (ref >60)
Glucose, Bld: 150 mg/dL — ABNORMAL HIGH (ref 70–99)
Potassium: 3.4 mmol/L — ABNORMAL LOW (ref 3.5–5.1)
Sodium: 138 mmol/L (ref 135–145)
Total Bilirubin: 0.3 mg/dL (ref 0.3–1.2)
Total Protein: 8.2 g/dL — ABNORMAL HIGH (ref 6.5–8.1)

## 2021-01-31 LAB — CBC WITH DIFFERENTIAL/PLATELET
Abs Immature Granulocytes: 0.03 10*3/uL (ref 0.00–0.07)
Basophils Absolute: 0 10*3/uL (ref 0.0–0.1)
Basophils Relative: 0 %
Eosinophils Absolute: 0.1 10*3/uL (ref 0.0–0.5)
Eosinophils Relative: 1 %
HCT: 35.5 % — ABNORMAL LOW (ref 36.0–46.0)
Hemoglobin: 11.4 g/dL — ABNORMAL LOW (ref 12.0–15.0)
Immature Granulocytes: 0 %
Lymphocytes Relative: 22 %
Lymphs Abs: 2.2 10*3/uL (ref 0.7–4.0)
MCH: 25.5 pg — ABNORMAL LOW (ref 26.0–34.0)
MCHC: 32.1 g/dL (ref 30.0–36.0)
MCV: 79.4 fL — ABNORMAL LOW (ref 80.0–100.0)
Monocytes Absolute: 0.7 10*3/uL (ref 0.1–1.0)
Monocytes Relative: 7 %
Neutro Abs: 6.8 10*3/uL (ref 1.7–7.7)
Neutrophils Relative %: 70 %
Platelets: 352 10*3/uL (ref 150–400)
RBC: 4.47 MIL/uL (ref 3.87–5.11)
RDW: 15.2 % (ref 11.5–15.5)
WBC: 9.8 10*3/uL (ref 4.0–10.5)
nRBC: 0 % (ref 0.0–0.2)

## 2021-01-31 LAB — URINALYSIS, MICROSCOPIC (REFLEX)

## 2021-01-31 LAB — PREGNANCY, URINE: Preg Test, Ur: NEGATIVE

## 2021-01-31 MED ORDER — HYDROCODONE-ACETAMINOPHEN 5-325 MG PO TABS: 1.0000 | ORAL_TABLET | Freq: Three times a day (TID) | ORAL | 0 refills | Status: AC | PRN

## 2021-01-31 MED ORDER — IBUPROFEN 600 MG PO TABS: 600.0000 mg | ORAL_TABLET | Freq: Four times a day (QID) | ORAL | 0 refills | Status: AC | PRN

## 2021-01-31 MED ORDER — KETOROLAC TROMETHAMINE 15 MG/ML IJ SOLN
7.5000 mg | Freq: Once | INTRAMUSCULAR | Status: AC
  Administered 2021-01-31: 7.5 mg via INTRAVENOUS
  Filled 2021-01-31: qty 1

## 2021-01-31 MED ORDER — ONDANSETRON 4 MG PO TBDP: 4.0000 mg | ORAL_TABLET | Freq: Three times a day (TID) | ORAL | 0 refills | Status: AC | PRN

## 2021-01-31 MED ORDER — TAMSULOSIN HCL 0.4 MG PO CAPS: 0.4000 mg | ORAL_CAPSULE | Freq: Every day | ORAL | 0 refills | Status: AC

## 2021-01-31 MED ORDER — KETOROLAC TROMETHAMINE 15 MG/ML IJ SOLN
15.0000 mg | Freq: Once | INTRAMUSCULAR | Status: AC
  Administered 2021-01-31: 15 mg via INTRAVENOUS
  Filled 2021-01-31: qty 1

## 2021-01-31 MED ORDER — TAMSULOSIN HCL 0.4 MG PO CAPS
0.4000 mg | ORAL_CAPSULE | Freq: Every day | ORAL | Status: DC
  Administered 2021-01-31: 0.4 mg via ORAL
  Filled 2021-01-31: qty 1

## 2021-01-31 NOTE — ED Triage Notes (Signed)
Urinary symptoms x 2 weeks and has been treating it at home but now having severe left flank pain.

## 2021-01-31 NOTE — ED Provider Notes (Signed)
[facility] EMERGENCY DEPARTMENT Provider Note  CSN: [id number] Arrival date & time: 01/31/21 0136  Chief Complaint(s) Flank Pain  HPI Elizabeth Gibson is a 45 y.o. female    Flank Pain The current episode started yesterday. The problem occurs constantly. The problem has not changed since onset.Associated symptoms include abdominal pain. Pertinent negatives include no chest pain, no headaches and no shortness of breath. Nothing aggravates the symptoms. Nothing relieves the symptoms. She has tried nothing for the symptoms.  Dysuria Pain quality:  Aching Pain severity:  Moderate Onset quality:  Gradual Duration:  2 weeks Timing:  Constant Progression:  Waxing and waning Chronicity:  New Relieved by:  Nothing Worsened by:  Nothing Associated symptoms: abdominal pain and flank pain    Past Medical History Past Medical History:  Diagnosis Date   Anxiety    History of heart attack    Hypertension    Hypothyroidism    Kidney stone    Vitamin D deficiency    Patient Active Problem List   Diagnosis Date Noted   Essential hypertension 09/04/2020   Vitamin D deficiency 08/24/2019   Prediabetes 04/11/2019   Class 3 severe obesity with serious comorbidity and body mass index (BMI) of 50.0 to 59.9 in adult (HCC) 04/11/2019   Wound dehiscence, cesarean 07/26/2014   Severe obesity (BMI >= 40) (HCC) 12/27/2013   Allergic reaction 09/18/2013   Routine general medical examination at a health care facility 12/08/2012   FOOT SPRAIN, RIGHT 06/03/2007   RENAL CALCULUS, HX OF 11/11/2006   Home Medication(s) Prior to Admission medications   Medication Sig Start Date End Date Taking? Authorizing Provider  HYDROcodone-acetaminophen (NORCO/VICODIN) 5-325 MG tablet Take 1 tablet by mouth every 8 (eight) hours as needed for up to 5 days for severe pain (That is not improved by your motrin regimen). Please do not exceed 4000 mg of acetaminophen (Tylenol) a 24-hour period. Please note that he  may be prescribed additional medicine that contains acetaminophen. 01/31/21 02/05/21 Yes Tore Carreker, Amadeo Garnet, MD  ibuprofen (ADVIL) 600 MG tablet Take 1 tablet (600 mg total) by mouth every 6 (six) hours as needed. 01/31/21  Yes Dacen Frayre, Amadeo Garnet, MD  ondansetron (ZOFRAN ODT) 4 MG disintegrating tablet Take 1 tablet (4 mg total) by mouth every 8 (eight) hours as needed for up to 3 days for nausea or vomiting. 01/31/21 02/03/21 Yes Lanise Mergen, Amadeo Garnet, MD  tamsulosin (FLOMAX) 0.4 MG CAPS capsule Take 1 capsule (0.4 mg total) by mouth daily for 5 days. 01/31/21 02/05/21 Yes Jentzen Minasyan, Amadeo Garnet, MD  cloNIDine (CATAPRES) 0.2 MG tablet Take 0.2 mg by mouth. Only take when BP is over 200    [provider]  Insulin Pen Needle (BD PEN NEEDLE NANO U/F) 32G X 4 MM MISC Use Nano needle with Ozempic 12/17/20   Quillian Quince D, MD  levothyroxine (SYNTHROID) 50 MCG tablet Take 1 tablet (50 mcg total) by mouth daily before breakfast. 01/02/21   Quillian Quince D, MD  Liraglutide -Weight Management (SAXENDA) 18 MG/3ML SOPN Inject 3 mg into the skin daily. Patient taking differently: Inject 0.6 mg into the skin daily. 12/17/20   Quillian Quince D, MD  losartan-hydrochlorothiazide (HYZAAR) 50-12.5 MG tablet Take 1 tablet by mouth daily. 12/17/20   Quillian Quince D, MD  metFORMIN (GLUCOPHAGE) 500 MG tablet Take 1 tablet (500 mg total) by mouth 2 (two) times daily with a meal. 12/17/20   Quillian Quince D, MD  Vitamin D, Ergocalciferol, (DRISDOL) 1.25 MG (50000 UNIT) CAPS  capsule Take 1 capsule (50,000 Units total) by mouth every 7 (seven) days. 12/17/20   Wilder Glade, MD                                                                                                                                    Past Surgical History Past Surgical History:  Procedure Laterality Date   CESAREAN SECTION     removal of kidney stones     Family History Family History  Problem Relation Age of Onset   Cancer Maternal Aunt         lung   Cancer Paternal Aunt        breast   Breast cancer Paternal Aunt    Cancer Paternal Grandmother        breast   Eating disorder Mother    Hypertension Father    Stroke Father    Heart disease Father     Social History Social History   Tobacco Use   Smoking status: Never   Smokeless tobacco: Never  Substance Use Topics   Alcohol use: No   Drug use: No   Allergies Patient has no known allergies.  Review of Systems Review of Systems  Respiratory:  Negative for shortness of breath.   Cardiovascular:  Negative for chest pain.  Gastrointestinal:  Positive for abdominal pain.  Genitourinary:  Positive for dysuria and flank pain.  Neurological:  Negative for headaches.  All other systems are reviewed and are negative for acute change except as noted in the HPI  Physical Exam Vital Signs  I have reviewed the triage vital signs BP (!) 141/82 (BP Location: Right Arm)   Pulse 86   Temp 98.1 F (36.7 C) (Oral)   Resp 18   Ht 5\' 6"  (1.676 m)   Wt 131.5 kg   SpO2 98%   BMI 46.81 kg/m   Physical Exam Vitals reviewed.  Constitutional:      General: She is not in acute distress.    Appearance: She is well-developed. She is not diaphoretic.  HENT:     Head: Normocephalic and atraumatic.     Right Ear: External ear normal.     Left Ear: External ear normal.     Nose: Nose normal.  Eyes:     General: No scleral icterus.    Conjunctiva/sclera: Conjunctivae normal.  Neck:     Trachea: Phonation normal.  Cardiovascular:     Rate and Rhythm: Normal rate and regular rhythm.  Pulmonary:     Effort: Pulmonary effort is normal. No respiratory distress.     Breath sounds: No stridor.  Abdominal:     General: There is no distension.     Tenderness: There is no abdominal tenderness. There is left CVA tenderness. There is no guarding or rebound.  Musculoskeletal:        General: Normal range of motion.     Cervical back: Normal range  of motion.  Neurological:      Mental Status: She is alert and oriented to person, place, and time.  Psychiatric:        Behavior: Behavior normal.    ED Results and Treatments Labs (all labs ordered are listed, but only abnormal results are displayed) Labs Reviewed  CBC WITH DIFFERENTIAL/PLATELET - Abnormal; Notable for the following components:      Result Value   Hemoglobin 11.4 (*)    HCT 35.5 (*)    MCV 79.4 (*)    MCH 25.5 (*)    All other components within normal limits  COMPREHENSIVE METABOLIC PANEL - Abnormal; Notable for the following components:   Potassium 3.4 (*)    Glucose, Bld 150 (*)    Total Protein 8.2 (*)    All other components within normal limits  URINALYSIS, ROUTINE W REFLEX MICROSCOPIC - Abnormal; Notable for the following components:   Color, Urine ORANGE (*)    Specific Gravity, Urine >1.030 (*)    Hgb urine dipstick MODERATE (*)    Ketones, ur 40 (*)    Protein, ur 30 (*)    All other components within normal limits  URINALYSIS, MICROSCOPIC (REFLEX) - Abnormal; Notable for the following components:   Bacteria, UA FEW (*)    All other components within normal limits  PREGNANCY, URINE                                                                                                                         EKG  EKG Interpretation  Date/Time:    Ventricular Rate:    PR Interval:    QRS Duration:   QT Interval:    QTC Calculation:   R Axis:     Text Interpretation:         Radiology CT Renal Stone Study  Result Date: 01/31/2021 CLINICAL DATA:  45 year old female with history of severe left-sided flank pain. EXAM: CT ABDOMEN AND PELVIS WITHOUT CONTRAST TECHNIQUE: Multidetector CT imaging of the abdomen and pelvis was performed following the standard protocol without IV contrast. COMPARISON:  No priors. FINDINGS: Lower chest: Unremarkable. Hepatobiliary: Mild diffuse low attenuation throughout the hepatic parenchyma, indicative of hepatic steatosis. No definite suspicious cystic  or solid hepatic lesions are confidently identified on today's noncontrast CT examination. Unenhanced appearance of the gallbladder is normal. Pancreas: No definite pancreatic mass or peripancreatic fluid collections or inflammatory changes are noted on today's noncontrast CT examination. Spleen: Unremarkable. Adrenals/Urinary Tract: Immediately before the left ureterovesicular junction (coronal image 33 of series 5) there is a 6 mm calculus which is associated with mild proximal left hydroureteronephrosis indicating mild obstruction. 2 mm nonobstructive calculus also noted in the upper pole collecting system of the right kidney. No additional calculi are noted along the course of the right ureter or within the lumen of the urinary bladder. No right hydroureteronephrosis. Unenhanced appearance of the kidneys, bilateral adrenal glands and urinary bladder is otherwise unremarkable. Stomach/Bowel: Unenhanced appearance of the stomach  is normal. No pathologic dilatation of small bowel or colon. Appendicolith in the proximal aspect of the appendix. The appendix is normal in caliber and does not appear inflamed. Vascular/Lymphatic: Aortic atherosclerosis. No lymphadenopathy noted in the abdomen or pelvis. Reproductive: Uterus and ovaries are unremarkable in appearance. Other: No significant volume of ascites.  No pneumoperitoneum. Musculoskeletal: There are no aggressive appearing lytic or blastic lesions noted in the visualized portions of the skeleton. IMPRESSION: 1. 6 mm obstructing calculus in the distal third of the left ureter immediately before the left ureterovesicular junction with mild proximal left hydroureteronephrosis. 2. 2 mm nonobstructive calculus in the upper pole collecting system of the right kidney. 3. Hepatic steatosis. 4. Aortic atherosclerosis. Electronically Signed   By: Trudie Reedaniel  Entrikin M.D.   On: 01/31/2021 05:18    Pertinent labs & imaging results that were available during my care of the  patient were reviewed by me and considered in my medical decision making (see MDM for details).  Medications Ordered in ED Medications  tamsulosin (FLOMAX) capsule 0.4 mg (has no administration in time range)  ketorolac (TORADOL) 15 MG/ML injection 15 mg (15 mg Intravenous Given 01/31/21 0241)                                                                                                                                    Procedures Procedures  (including critical care time)  Medical Decision Making / ED Course I have reviewed the nursing notes for this encounter and the patient's prior records (if available in EHR or on provided paperwork).  Elizabeth LeitzCasey Gibson was evaluated in Emergency Department on 01/31/2021 for the symptoms described in the history of present illness. She was evaluated in the context of the global COVID-19 pandemic, which necessitated consideration that the patient might be at risk for infection with the SARS-CoV-2 virus that causes COVID-19. Institutional protocols and algorithms that pertain to the evaluation of patients at risk for COVID-19 are in a state of rapid change based on information released by regulatory bodies including the CDC and federal and state organizations. These policies and algorithms were followed during the patient's care in the ED.     2 weeks of dysuria with 2 days of flank pain. Left CVA TTP, otherwise benign. CBC without leukocytosis or significant anemia. Metabolic panel with no significant electrolyte derangements or renal sufficiency. UA with hematuria. No infection. CT notable for 6mm stone in left ureter.  Pertinent labs & imaging results that were available during my care of the patient were reviewed by me and considered in my medical decision making:  Pain controlled with 1 dose of toradol.   Final Clinical Impression(s) / ED Diagnoses Final diagnoses:  Ureterolithiasis  Ureteral colic   The patient appears reasonably screened and/or  stabilized for discharge and I doubt any other medical condition or other [facility] requiring further screening, evaluation, or treatment in the ED at this time prior to  discharge. Safe for discharge with strict return precautions.  Disposition: Discharge  Condition: Good  I have discussed the results, Dx and Tx plan with the patient/family who expressed understanding and agree(s) with the plan. Discharge instructions discussed at length. The patient/family was given strict return precautions who verbalized understanding of the instructions. No further questions at time of discharge.    ED Discharge Orders          Ordered    ibuprofen (ADVIL) 600 MG tablet  Every 6 hours PRN        01/31/21 0540    HYDROcodone-acetaminophen (NORCO/VICODIN) 5-325 MG tablet  Every 8 hours PRN        01/31/21 0540    ondansetron (ZOFRAN ODT) 4 MG disintegrating tablet  Every 8 hours PRN        01/31/21 0540    tamsulosin (FLOMAX) 0.4 MG CAPS capsule  Daily        01/31/21 0540            [facility] narcotic database reviewed and no active prescriptions noted.   Follow Up: Jannifer Hick, MD Urology [address] [place] [address] [phone] Schedule an appointment as soon as possible for a visit  if symptoms do not improve or  worsen  Lewis Moccasin, MD [address] [place] [address] [phone]  Call  as needed     This chart was dictated using voice recognition software.  Despite best efforts to proofread,  errors can occur which can change the documentation meaning.    Nira Conn, MD 01/31/21 [phone]

## 2021-02-01 ENCOUNTER — Emergency Department (HOSPITAL_COMMUNITY)
Admission: EM | Admit: 2021-02-01 | Discharge: 2021-02-01 | Discharge status: Against Medical Advice | Payer: BC Managed Care – PPO

## 2021-02-01 ENCOUNTER — Other Ambulatory Visit: Payer: Self-pay

## 2021-02-03 ENCOUNTER — Ambulatory Visit (INDEPENDENT_AMBULATORY_CARE_PROVIDER_SITE_OTHER): Payer: BC Managed Care – PPO | Admitting: Adult Health

## 2021-02-04 ENCOUNTER — Other Ambulatory Visit: Payer: Self-pay | Admitting: Urology

## 2021-02-11 ENCOUNTER — Encounter (HOSPITAL_BASED_OUTPATIENT_CLINIC_OR_DEPARTMENT_OTHER): Payer: Self-pay | Admitting: Urology

## 2021-02-14 ENCOUNTER — Ambulatory Visit (HOSPITAL_BASED_OUTPATIENT_CLINIC_OR_DEPARTMENT_OTHER): Admission: RE | Admit: 2021-02-14 | Payer: BC Managed Care – PPO | Source: Home / Self Care | Admitting: Urology

## 2021-02-14 HISTORY — DX: Calculus of ureter: N20.1

## 2021-02-14 HISTORY — DX: Prediabetes: R73.03

## 2021-02-14 SURGERY — CYSTOSCOPY/URETEROSCOPY/HOLMIUM LASER/STENT PLACEMENT
Anesthesia: General | Laterality: Left

## 2021-02-17 ENCOUNTER — Other Ambulatory Visit (INDEPENDENT_AMBULATORY_CARE_PROVIDER_SITE_OTHER): Payer: Self-pay | Admitting: Family Medicine

## 2021-02-17 DIAGNOSIS — I10 Essential (primary) hypertension: Secondary | ICD-10-CM

## 2021-02-17 DIAGNOSIS — E559 Vitamin D deficiency, unspecified: Secondary | ICD-10-CM

## 2021-02-18 NOTE — Telephone Encounter (Signed)
Pt last seen by Dr. Beasley.  

## 2021-03-20 ENCOUNTER — Ambulatory Visit (INDEPENDENT_AMBULATORY_CARE_PROVIDER_SITE_OTHER): Payer: BC Managed Care – PPO | Admitting: Family Medicine

## 2021-03-20 ENCOUNTER — Encounter (INDEPENDENT_AMBULATORY_CARE_PROVIDER_SITE_OTHER): Payer: Self-pay | Admitting: Family Medicine

## 2021-03-20 ENCOUNTER — Other Ambulatory Visit: Payer: Self-pay

## 2021-03-20 VITALS — HR 84 | Temp 97.8°F | Ht 66.0 in | Wt 326.0 lb

## 2021-03-20 DIAGNOSIS — I10 Essential (primary) hypertension: Secondary | ICD-10-CM | POA: Diagnosis not present

## 2021-03-20 DIAGNOSIS — Z9189 Other specified personal risk factors, not elsewhere classified: Secondary | ICD-10-CM

## 2021-03-20 DIAGNOSIS — Z6841 Body Mass Index (BMI) 40.0 and over, adult: Secondary | ICD-10-CM

## 2021-03-20 DIAGNOSIS — E559 Vitamin D deficiency, unspecified: Secondary | ICD-10-CM

## 2021-03-20 DIAGNOSIS — E038 Other specified hypothyroidism: Secondary | ICD-10-CM

## 2021-03-20 MED ORDER — VITAMIN D (ERGOCALCIFEROL) 1.25 MG (50000 UNIT) PO CAPS: 50000.0000 [IU] | ORAL_CAPSULE | ORAL | 0 refills | Status: AC

## 2021-03-20 MED ORDER — METFORMIN HCL 500 MG PO TABS
500.0000 mg | ORAL_TABLET | Freq: Two times a day (BID) | ORAL | 0 refills | Status: AC
Start: 2021-03-20 — End: ?

## 2021-03-20 MED ORDER — SAXENDA 18 MG/3ML ~~LOC~~ SOPN: PEN_INJECTOR | SUBCUTANEOUS | 0 refills | Status: DC

## 2021-03-20 MED ORDER — BD PEN NEEDLE NANO U/F 32G X 4 MM MISC: 0 refills | Status: AC

## 2021-03-20 MED ORDER — LEVOTHYROXINE SODIUM 50 MCG PO TABS: 50.0000 ug | ORAL_TABLET | Freq: Every day | ORAL | 0 refills | Status: AC

## 2021-03-20 MED ORDER — LOSARTAN POTASSIUM-HCTZ 50-12.5 MG PO TABS: 1.0000 | ORAL_TABLET | Freq: Every day | ORAL | 0 refills | Status: AC

## 2021-03-20 NOTE — Progress Notes (Signed)
Chief Complaint:   OBESITY Elizabeth Gibson is here to discuss her progress with her obesity treatment plan along with follow-up of her obesity related diagnoses. Elizabeth Gibson is on following a lower carbohydrate, vegetable and lean protein rich diet plan and states Elizabeth Gibson is following her eating plan approximately 0% of the time. Elizabeth Gibson states Elizabeth Gibson is doing 0 minutes 0 times per week.  Today's visit was #: 33 Starting weight: 327 lbs Starting date: 07/14/2018 Today's weight: 326 lbs Today's date: 03/20/2021 Total lbs lost to date: 1 Total lbs lost since last in-office visit: 2  Interim History: Elizabeth Gibson's life has been hectic and Elizabeth Gibson has been off track. Elizabeth Gibson is ready to get started again. Elizabeth Gibson has been off her medications for >1 month.  Subjective:   1. Other specified hypothyroidism Elizabeth Gibson has been off of her medications for > 1 month. Elizabeth Gibson feels tired and lightheaded at times.  2. Vitamin D deficiency Elizabeth Gibson has been off of Vit D, and Elizabeth Gibson is ready to restart.  3. Essential hypertension Elizabeth Gibson has been off of her medications for >1 month. Her blood pressure is elevated, an Elizabeth Gibson is ready to get back to taking care of her health.  4. At risk for heart disease Elizabeth Gibson is at a higher than average risk for cardiovascular disease due to obesity.   Assessment/Plan:   1. Other specified hypothyroidism Elizabeth Gibson will continue her medications, and we will refill levothyroxine for 1 month. We will recheck labs in 6 weeks. Orders and follow up as documented in patient record.  - levothyroxine (SYNTHROID) 50 MCG tablet; Take 1 tablet (50 mcg total) by mouth daily before breakfast.  Dispense: 30 tablet; Refill: 0  2. Vitamin D deficiency Low Vitamin D level contributes to fatigue and are associated with obesity, breast, and colon cancer. We will refill prescription Vitamin D 50,000 IU every week for 1 month. We will recheck labs in 6 weeks, and Elizabeth Gibson will follow-up for routine testing of Vitamin D, at least 2-3 times per  year to avoid over-replacement.  - Vitamin D, Ergocalciferol, (DRISDOL) 1.25 MG (50000 UNIT) CAPS capsule; Take 1 capsule (50,000 Units total) by mouth every 7 (seven) days.  Dispense: 4 capsule; Refill: 0  3. Essential hypertension Elizabeth Gibson ill continue her medications, and we will refill losartan-hydrochlorothiazide for 1 month. We will recheck labs in 6 weeks. Elizabeth Gibson will continue working on healthy weight loss and exercise to improve blood pressure control. Elizabeth Gibson will watch for signs of hypotension as Elizabeth Gibson continues her lifestyle modifications.  - losartan-hydrochlorothiazide (HYZAAR) 50-12.5 MG tablet; Take 1 tablet by mouth daily.  Dispense: 30 tablet; Refill: 0  4. At risk for heart disease Elizabeth Gibson was given approximately 15 minutes of coronary artery disease prevention counseling today. Elizabeth Gibson is 45 y.o. female and has risk factors for heart disease including obesity. We discussed intensive lifestyle modifications today with an emphasis on specific weight loss instructions and strategies.   Repetitive spaced learning was employed today to elicit superior memory formation and behavioral change.  5. Obesity with current BMI 52.6 Elizabeth Gibson is currently in the action stage of change. As such, her goal is to continue with weight loss efforts. Elizabeth Gibson has agreed to the Category 3 Plan.   We discussed various medication options to help Elizabeth Gibson with her weight loss efforts and we both agreed to start Saxenda at 0.6 mg and will slowly increase. We will refill Saxenda at 3.0 mg, and nano needles #100 with no refills.  - Insulin Pen Needle (  BD PEN NEEDLE NANO U/F) 32G X 4 MM MISC; Use Nano needle with Ozempic  Dispense: 100 each; Refill: 0 - Liraglutide -Weight Management (SAXENDA) 18 MG/3ML SOPN; Inject 0.6 into skin daily.  Dispense: 18 mL; Refill: 0 - metFORMIN (GLUCOPHAGE) 500 MG tablet; Take 1 tablet (500 mg total) by mouth 2 (two) times daily with a meal.  Dispense: 60 tablet; Refill: 0  Behavioral modification  strategies: increasing lean protein intake and decreasing simple carbohydrates.  Elizabeth Gibson has agreed to follow-up with our clinic in 2 weeks. Elizabeth Gibson was informed of the importance of frequent follow-up visits to maximize her success with intensive lifestyle modifications for her multiple health conditions.   Objective:   Pulse 84, temperature 97.8 F (36.6 C), height 5\' 6"  (1.676 m), weight (!) 326 lb (147.9 kg), SpO2 98 %. Body mass index is 52.62 kg/m.  General: Cooperative, alert, well developed, in no acute distress. HEENT: Conjunctivae and lids unremarkable. Cardiovascular: Regular rhythm.  Lungs: Normal work of breathing. Neurologic: No focal deficits.   Lab Results  Component Value Date   CREATININE 0.85 01/31/2021   BUN 15 01/31/2021   NA 138 01/31/2021   K 3.4 (L) 01/31/2021   CL 101 01/31/2021   CO2 24 01/31/2021   Lab Results  Component Value Date   ALT 17 01/31/2021   AST 16 01/31/2021   ALKPHOS 78 01/31/2021   BILITOT 0.3 01/31/2021   Lab Results  Component Value Date   HGBA1C 6.1 (H) 07/23/2020   HGBA1C 5.7 (H) 12/04/2019   HGBA1C 5.7 (H) 07/06/2019   HGBA1C 5.8 03/28/2019   HGBA1C 5.6 12/19/2018   Lab Results  Component Value Date   INSULIN 34.7 (H) 07/23/2020   INSULIN 35.6 (H) 12/04/2019   INSULIN 18.9 07/06/2019   INSULIN 29.1 (H) 12/19/2018   INSULIN 27.3 (H) 07/14/2018   Lab Results  Component Value Date   TSH 3.980 07/23/2020   Lab Results  Component Value Date   CHOL 219 (H) 07/23/2020   HDL 52 07/23/2020   LDLCALC 142 (H) 07/23/2020   TRIG 138 07/23/2020   CHOLHDL 3 12/27/2013   Lab Results  Component Value Date   VD25OH 19.4 (L) 07/23/2020   VD25OH 36.3 12/04/2019   VD25OH 32.0 07/06/2019   Lab Results  Component Value Date   WBC 9.8 01/31/2021   HGB 11.4 (L) 01/31/2021   HCT 35.5 (L) 01/31/2021   MCV 79.4 (L) 01/31/2021   PLT 352 01/31/2021   No results found for: IRON, TIBC, FERRITIN  Attestation Statements:   Reviewed  by clinician on day of visit: allergies, medications, problem list, medical history, surgical history, family history, social history, and previous encounter notes.   I, 04/02/2021, am acting as transcriptionist for Burt Knack, MD.  I have reviewed the above documentation for accuracy and completeness, and I agree with the above. -  Quillian Quince, MD

## 2021-04-03 ENCOUNTER — Ambulatory Visit (INDEPENDENT_AMBULATORY_CARE_PROVIDER_SITE_OTHER): Payer: BC Managed Care – PPO | Admitting: Family Medicine

## 2021-04-03 ENCOUNTER — Other Ambulatory Visit: Payer: Self-pay

## 2021-04-03 ENCOUNTER — Encounter (INDEPENDENT_AMBULATORY_CARE_PROVIDER_SITE_OTHER): Payer: Self-pay | Admitting: Family Medicine

## 2021-04-03 VITALS — BP 133/72 | HR 98 | Temp 97.8°F | Ht 66.0 in | Wt 322.0 lb

## 2021-04-03 DIAGNOSIS — R7303 Prediabetes: Secondary | ICD-10-CM | POA: Diagnosis not present

## 2021-04-03 DIAGNOSIS — Z6841 Body Mass Index (BMI) 40.0 and over, adult: Secondary | ICD-10-CM

## 2021-04-03 DIAGNOSIS — E7849 Other hyperlipidemia: Secondary | ICD-10-CM | POA: Diagnosis not present

## 2021-04-03 DIAGNOSIS — Z9189 Other specified personal risk factors, not elsewhere classified: Secondary | ICD-10-CM

## 2021-04-03 DIAGNOSIS — E559 Vitamin D deficiency, unspecified: Secondary | ICD-10-CM | POA: Diagnosis not present

## 2021-04-03 DIAGNOSIS — E038 Other specified hypothyroidism: Secondary | ICD-10-CM

## 2021-04-03 MED ORDER — SAXENDA 18 MG/3ML ~~LOC~~ SOPN: PEN_INJECTOR | SUBCUTANEOUS | 0 refills | Status: AC

## 2021-04-03 NOTE — Progress Notes (Signed)
At risk impa

## 2021-04-07 NOTE — Progress Notes (Signed)
Chief Complaint:   OBESITY Elizabeth Gibson is here to discuss her progress with her obesity treatment plan along with follow-up of her obesity related diagnoses. Elizabeth Gibson is on the Category 3 Plan and states she is following her eating plan approximately 90% of the time. Elizabeth Gibson states she is walking and doing muscle training for 45 minutes 7 times per week.  Today's visit was #: 75 Starting weight: 327 lbs Starting date: 07/14/2018 Today's weight: 322 lbs Today's date: 04/03/2021 Total lbs lost to date: 5 Total lbs lost since last in-office visit: 4  Interim History: Elizabeth Gibson is doing better with exercising and meal planning. She is getting better support.  Subjective:   1. Other hyperlipidemia Elizabeth Gibson is working on diet and weight loss, and she is due for labs. She denies chest pain.  2. Vitamin D deficiency Elizabeth Gibson is on Vit D, and she denies nausea, vomiting, or muscle weakness. She is due for labs.  3. Pre-diabetes Elizabeth Gibson is working on diet and weight loss, and she is on GLP-1 and she is doing well with exercise.  4. Other specified hypothyroidism Elizabeth Gibson is on Synthroid, and she is due for labs. No tremors or palpitations noted.  5. At risk for impaired metabolic function Elizabeth Gibson is at increased risk for impaired metabolic function if calories or protein decreases too low.  Assessment/Plan:   1. Other hyperlipidemia Cardiovascular risk and specific lipid/LDL goals reviewed. We discussed several lifestyle modifications today. We will check labs today. Elizabeth Gibson will continue to work on diet, exercise and weight loss efforts. Orders and follow up as documented in patient record.   - Lipid Panel With LDL/HDL Ratio  2. Vitamin D deficiency Low Vitamin D level contributes to fatigue and are associated with obesity, breast, and colon cancer. We will check labs today. Elizabeth Gibson will follow-up for routine testing of Vitamin D, at least 2-3 times per year to avoid over-replacement.  - VITAMIN D 25 Hydroxy  (Vit-D Deficiency, Fractures)  3. Pre-diabetes Elizabeth Gibson will continue to work on weight loss, exercise, and decreasing simple carbohydrates to help decrease the risk of diabetes. We will check labs today and will follow up at her next visit.  - CMP14+EGFR - Hemoglobin A1c - Insulin, random  4. Other specified hypothyroidism We will check labs today. We will follow up at Elizabeth Gibson's next visit. Orders and follow up as documented in patient record.  - TSH - T4, free - T3  5. At risk for impaired metabolic function Elizabeth Gibson was given approximately 15 minutes of impaired  metabolic function prevention counseling today. We discussed intensive lifestyle modifications today with an emphasis on specific nutrition and exercise instructions and strategies.   Repetitive spaced learning was employed today to elicit superior memory formation and behavioral change.  6. Obesity with current BMI 52.1 Elizabeth Gibson is currently in the action stage of change. As such, her goal is to continue with weight loss efforts. She has agreed to the Category 3 Plan.   We discussed various medication options to help Nalanie with her weight loss efforts and we both agreed to Elizabeth Gibson, and we will refill for 1 month.  - Liraglutide -Weight Management (SAXENDA) 18 MG/3ML SOPN; Inject 0.6 into skin daily.  Dispense: 18 mL; Refill: 0  Exercise goals: As is.  Behavioral modification strategies: increasing lean protein intake and no skipping meals.  Elizabeth Gibson has agreed to follow-up with our clinic in 2 to 3 weeks. She was informed of the importance of frequent follow-up visits to maximize her  success with intensive lifestyle modifications for her multiple health conditions.   Objective:   Blood pressure 133/72, pulse 98, temperature 97.8 F (36.6 C), height 5' 6" (1.676 m), weight (!) 322 lb (146.1 kg), SpO2 98 %. Body mass index is 51.97 kg/m.  General: Cooperative, alert, well developed, in no acute distress. HEENT: Conjunctivae and  lids unremarkable. Cardiovascular: Regular rhythm.  Lungs: Normal work of breathing. Neurologic: No focal deficits.   Lab Results  Component Value Date   CREATININE 0.85 01/31/2021   BUN 15 01/31/2021   NA 138 01/31/2021   K 3.4 (L) 01/31/2021   CL 101 01/31/2021   CO2 24 01/31/2021   Lab Results  Component Value Date   ALT 17 01/31/2021   AST 16 01/31/2021   ALKPHOS 78 01/31/2021   BILITOT 0.3 01/31/2021   Lab Results  Component Value Date   HGBA1C 6.1 (H) 07/23/2020   HGBA1C 5.7 (H) 12/04/2019   HGBA1C 5.7 (H) 07/06/2019   HGBA1C 5.8 03/28/2019   HGBA1C 5.6 12/19/2018   Lab Results  Component Value Date   INSULIN 34.7 (H) 07/23/2020   INSULIN 35.6 (H) 12/04/2019   INSULIN 18.9 07/06/2019   INSULIN 29.1 (H) 12/19/2018   INSULIN 27.3 (H) 07/14/2018   Lab Results  Component Value Date   TSH 3.980 07/23/2020   Lab Results  Component Value Date   CHOL 219 (H) 07/23/2020   HDL 52 07/23/2020   LDLCALC 142 (H) 07/23/2020   TRIG 138 07/23/2020   CHOLHDL 3 12/27/2013   Lab Results  Component Value Date   VD25OH 19.4 (L) 07/23/2020   VD25OH 36.3 12/04/2019   VD25OH 32.0 07/06/2019   Lab Results  Component Value Date   WBC 9.8 01/31/2021   HGB 11.4 (L) 01/31/2021   HCT 35.5 (L) 01/31/2021   MCV 79.4 (L) 01/31/2021   PLT 352 01/31/2021   No results found for: IRON, TIBC, FERRITIN  Attestation Statements:   Reviewed by clinician on day of visit: allergies, medications, problem list, medical history, surgical history, family history, social history, and previous encounter notes.   I, Trixie Dredge, am acting as transcriptionist for Dennard Nip, MD.  I have reviewed the above documentation for accuracy and completeness, and I agree with the above. -  Dennard Nip, MD

## 2021-04-14 ENCOUNTER — Other Ambulatory Visit (INDEPENDENT_AMBULATORY_CARE_PROVIDER_SITE_OTHER): Payer: Self-pay | Admitting: Family Medicine

## 2021-04-14 DIAGNOSIS — E038 Other specified hypothyroidism: Secondary | ICD-10-CM

## 2021-04-14 DIAGNOSIS — I10 Essential (primary) hypertension: Secondary | ICD-10-CM

## 2021-04-14 DIAGNOSIS — Z6841 Body Mass Index (BMI) 40.0 and over, adult: Secondary | ICD-10-CM

## 2021-04-17 ENCOUNTER — Telehealth (INDEPENDENT_AMBULATORY_CARE_PROVIDER_SITE_OTHER): Payer: BC Managed Care – PPO | Admitting: Family Medicine

## 2021-04-17 ENCOUNTER — Encounter (INDEPENDENT_AMBULATORY_CARE_PROVIDER_SITE_OTHER): Payer: Self-pay | Admitting: Family Medicine

## 2021-04-17 DIAGNOSIS — R7303 Prediabetes: Secondary | ICD-10-CM | POA: Diagnosis not present

## 2021-04-17 DIAGNOSIS — Z6841 Body Mass Index (BMI) 40.0 and over, adult: Secondary | ICD-10-CM

## 2021-04-17 NOTE — Progress Notes (Signed)
TeleHealth Visit:  Due to the COVID-19 pandemic, this visit was completed with telemedicine (audio/video) technology to reduce patient and provider exposure as well as to preserve personal protective equipment.   Elizabeth Gibson has verbally consented to this TeleHealth visit. The patient is located at home, the provider is located at the [facility] office. The participants in this visit include the listed provider and patient. The visit was conducted today via MyChart video.   Chief Complaint: OBESITY Elizabeth Gibson is here to discuss her progress with her obesity treatment plan along with follow-up of her obesity related diagnoses. Elizabeth Gibson is on the Category 3 Plan and states she is following her eating plan approximately 90% of the time. Elizabeth Gibson states she is walking for 45 minutes 6 times per week.  Today's visit was #: 35 Starting weight: 327 lbs Starting date: 07/14/2018  Interim History: Elizabeth Gibson has been sick for 3 days, but before this she has done well with following her meal plan.  Subjective:   1. Pre-diabetes Elizabeth Gibson has been trying to increase vegetables and protein, and decrease simple carbohydrates in her diet. She is tolerating metformin and Saxenda well with decreased polyphagia. Her last A1c was 6.1.  Assessment/Plan:   1. Pre-diabetes Elizabeth Gibson will continue her diet, exercise, and medications, and decreasing simple carbohydrates to help decrease the risk of diabetes. We will recheck labs in 1 month.  2. Obesity with current BMI 52.1 Elizabeth Gibson is currently in the action stage of change. As such, her goal is to continue with weight loss efforts. She has agreed to the Category 3 Plan.   Exercise goals: As is.  Behavioral modification strategies: meal planning and cooking strategies.  Elizabeth Gibson has agreed to follow-up with our clinic in 2 weeks. She was informed of the importance of frequent follow-up visits to maximize her success with intensive lifestyle modifications for her  multiple health conditions.  Objective:   VITALS: Per patient if applicable, see vitals. GENERAL: Alert and in no acute distress. CARDIOPULMONARY: No increased WOB. Speaking in clear sentences.  PSYCH: Pleasant and cooperative. Speech normal rate and rhythm. Affect is appropriate. Insight and judgement are appropriate. Attention is focused, linear, and appropriate.  NEURO: Oriented as arrived to appointment on time with no prompting.   Lab Results  Component Value Date   CREATININE 0.85 01/31/2021   BUN 15 01/31/2021   NA 138 01/31/2021   K 3.4 (L) 01/31/2021   CL 101 01/31/2021   CO2 24 01/31/2021   Lab Results  Component Value Date   ALT 17 01/31/2021   AST 16 01/31/2021   ALKPHOS 78 01/31/2021   BILITOT 0.3 01/31/2021   Lab Results  Component Value Date   HGBA1C 6.1 (H) 07/23/2020   HGBA1C 5.7 (H) 12/04/2019   HGBA1C 5.7 (H) 07/06/2019   HGBA1C 5.8 03/28/2019   HGBA1C 5.6 12/19/2018   Lab Results  Component Value Date   INSULIN 34.7 (H) 07/23/2020   INSULIN 35.6 (H) 12/04/2019   INSULIN 18.9 07/06/2019   INSULIN 29.1 (H) 12/19/2018   INSULIN 27.3 (H) 07/14/2018   Lab Results  Component Value Date   TSH 3.980 07/23/2020   Lab Results  Component Value Date   CHOL 219 (H) 07/23/2020   HDL 52 07/23/2020   LDLCALC 142 (H) 07/23/2020   TRIG 138 07/23/2020   CHOLHDL 3 12/27/2013   Lab Results  Component Value Date   VD25OH 19.4 (L) 07/23/2020   VD25OH 36.3 12/04/2019   VD25OH 32.0 07/06/2019  Lab Results  Component Value Date   WBC 9.8 01/31/2021   HGB 11.4 (L) 01/31/2021   HCT 35.5 (L) 01/31/2021   MCV 79.4 (L) 01/31/2021   PLT 352 01/31/2021   No results found for: IRON, TIBC, FERRITIN  Attestation Statements:   Reviewed by clinician on day of visit: allergies, medications, problem list, medical history, surgical history, family history, social history, and previous encounter notes.   I, Burt Knack, am acting as transcriptionist for Quillian Quince, MD.  I have reviewed the above documentation for accuracy and completeness, and I agree with the above. - Quillian Quince, MD

## 2021-05-01 ENCOUNTER — Ambulatory Visit (INDEPENDENT_AMBULATORY_CARE_PROVIDER_SITE_OTHER): Payer: BC Managed Care – PPO | Admitting: Family Medicine

## 2021-07-27 ENCOUNTER — Other Ambulatory Visit (INDEPENDENT_AMBULATORY_CARE_PROVIDER_SITE_OTHER): Payer: Self-pay | Admitting: Family Medicine

## 2021-07-27 DIAGNOSIS — E559 Vitamin D deficiency, unspecified: Secondary | ICD-10-CM

## 2021-08-05 ENCOUNTER — Telehealth (INDEPENDENT_AMBULATORY_CARE_PROVIDER_SITE_OTHER): Payer: Self-pay | Admitting: Family Medicine

## 2021-08-05 NOTE — Telephone Encounter (Signed)
Lollie Marrow from [employer] called regarding patient's Saxenda. Phone [phone] and reference number [place].

## 2021-11-07 ENCOUNTER — Other Ambulatory Visit: Payer: Self-pay | Admitting: Family Medicine

## 2021-11-07 DIAGNOSIS — Z1231 Encounter for screening mammogram for malignant neoplasm of breast: Secondary | ICD-10-CM

## 2021-11-14 ENCOUNTER — Ambulatory Visit
Admission: RE | Admit: 2021-11-14 | Discharge: 2021-11-14 | Disposition: A | Discharge status: Home / Self Care | Payer: BC Managed Care – PPO | Source: Ambulatory Visit | Attending: Family Medicine | Admitting: Family Medicine

## 2021-11-14 DIAGNOSIS — Z1231 Encounter for screening mammogram for malignant neoplasm of breast: Secondary | ICD-10-CM

## 2021-12-16 ENCOUNTER — Ambulatory Visit: Payer: Self-pay | Admitting: Surgery

## 2022-01-21 ENCOUNTER — Inpatient Hospital Stay (HOSPITAL_COMMUNITY)
Admission: RE | Admit: 2022-01-21 | Discharge: 2022-01-21 | Disposition: A | Discharge status: Home / Self Care | Payer: BC Managed Care – PPO | Source: Ambulatory Visit

## 2022-01-21 NOTE — Pre-Procedure Instructions (Signed)
Surgical Instructions    Your procedure is scheduled on Tuesday, August 1st.  Report to [facility] Main Entrance "A" at 05:30 A.M., then check in with the Admitting office.  Call this number if you have problems the morning of surgery:  [phone]   If you have any questions prior to your surgery date call [phone]: Open Monday-Friday 8am-4pm    Remember:  Do not eat after midnight the night before your surgery  You may drink clear liquids until 04:30 AM the morning of your surgery.   Clear liquids allowed are: Water, Non-Citrus Juices (without pulp), Carbonated Beverages, Clear Tea, Black Coffee Only (NO MILK, CREAM OR POWDERED CREAMER of any kind), and Gatorade.    Take these medicines the morning of surgery with A SIP OF WATER  levothyroxine (SYNTHROID)  If needed: cloNIDine (CATAPRES)    WHAT DO I DO ABOUT MY DIABETES MEDICATION?   Do not take Liraglutide -Weight Management (SAXENDA) or  metFORMIN (GLUCOPHAGE) the morning of surgery.    HOW TO MANAGE YOUR DIABETES BEFORE AND AFTER SURGERY  Why is it important to control my blood sugar before and after surgery? Improving blood sugar levels before and after surgery helps healing and can limit problems. A way of improving blood sugar control is eating a healthy diet by:  Eating less sugar and carbohydrates  Increasing activity/exercise  Talking with your doctor about reaching your blood sugar goals High blood sugars (greater than 180 mg/dL) can raise your risk of infections and slow your recovery, so you will need to focus on controlling your diabetes during the weeks before surgery. Make sure that the doctor who takes care of your diabetes knows about your planned surgery including the date and location.  How do I manage my blood sugar before surgery? Check your blood sugar at least 4 times a day, starting 2 days before surgery, to make sure that the level is not too high or low.  Check your blood sugar the  morning of your surgery when you wake up and every 2 hours until you get to the Short Stay unit.  If your blood sugar is less than 70 mg/dL, you will need to treat for low blood sugar: Do not take insulin. Treat a low blood sugar (less than 70 mg/dL) with  cup of clear juice (cranberry or apple), 4 glucose tablets, OR glucose gel. Recheck blood sugar in 15 minutes after treatment (to make sure it is greater than 70 mg/dL). If your blood sugar is not greater than 70 mg/dL on recheck, call [phone] for further instructions. Report your blood sugar to the short stay nurse when you get to Short Stay.  If you are admitted to the hospital after surgery: Your blood sugar will be checked by the staff and you will probably be given insulin after surgery (instead of oral diabetes medicines) to make sure you have good blood sugar levels. The goal for blood sugar control after surgery is 80-180 mg/dL.   As of today, STOP taking any Aspirin (unless otherwise instructed by your surgeon) Aleve, Naproxen, Ibuprofen, Motrin, Advil, Goody's, BC's, all herbal medications, fish oil, and all vitamins.                     Do NOT Smoke (Tobacco/Vaping) for 24 hours prior to your procedure.  If you use a CPAP at night, you may bring your mask/headgear for your overnight stay.   Contacts, glasses, piercing's, hearing aid's, dentures or partials may not  be worn into surgery, please bring cases for these belongings.    For patients admitted to the hospital, discharge time will be determined by your treatment team.   Patients discharged the day of surgery will not be allowed to drive home, and someone needs to stay with them for 24 hours.  SURGICAL WAITING ROOM VISITATION Patients having surgery or a procedure may have no more than 2 support people in the waiting area - these visitors may rotate.   Children under the age of 92 must have an adult with them who is not the patient. If the patient needs to stay at  the hospital during part of their recovery, the visitor guidelines for inpatient rooms apply. Pre-op nurse will coordinate an appropriate time for 1 support person to accompany patient in pre-op.  This support person may not rotate.   Please refer to the [facility] website for the visitor guidelines for Inpatients (after your surgery is over and you are in a regular room).    Special instructions:   [facility]- Preparing For Surgery  Before surgery, you can play an important role. Because skin is not sterile, your skin needs to be as free of germs as possible. You can reduce the number of germs on your skin by washing with CHG (chlorahexidine gluconate) Soap before surgery.  CHG is an antiseptic cleaner which kills germs and bonds with the skin to continue killing germs even after washing.    Oral Hygiene is also important to reduce your risk of infection.  Remember - BRUSH YOUR TEETH THE MORNING OF SURGERY WITH YOUR REGULAR TOOTHPASTE  Please do not use if you have an allergy to CHG or antibacterial soaps. If your skin becomes reddened/irritated stop using the CHG.  Do not shave (including legs and underarms) for at least 48 hours prior to first CHG shower. It is OK to shave your face.  Please follow these instructions carefully.   Shower the NIGHT BEFORE SURGERY and the MORNING OF SURGERY  If you chose to wash your hair, wash your hair first as usual with your normal shampoo.  After you shampoo, rinse your hair and body thoroughly to remove the shampoo.  Use CHG Soap as you would any other liquid soap. You can apply CHG directly to the skin and wash gently with a scrungie or a clean washcloth.   Apply the CHG Soap to your body ONLY FROM THE NECK DOWN.  Do not use on open wounds or open sores. Avoid contact with your eyes, ears, mouth and genitals (private parts). Wash Face and genitals (private parts)  with your normal soap.   Wash thoroughly, paying special attention to the area where  your surgery will be performed.  Thoroughly rinse your body with warm water from the neck down.  DO NOT shower/wash with your normal soap after using and rinsing off the CHG Soap.  Pat yourself dry with a CLEAN TOWEL.  Wear CLEAN PAJAMAS to bed the night before surgery  Place CLEAN SHEETS on your bed the night before your surgery  DO NOT SLEEP WITH PETS.   Day of Surgery: Take a shower with CHG soap. Do not wear jewelry or makeup Do not wear lotions, powders, perfumes, or deodorant. Do not shave 48 hours prior to surgery.   Do not bring valuables to the hospital. [facility] is not responsible for any belongings or valuables. Do not wear nail polish, gel polish, artificial nails, or any other type of covering on natural  nails (fingers and toes) If you have artificial nails or gel coating that need to be removed by a nail salon, please have this removed prior to surgery. Artificial nails or gel coating may interfere with anesthesia's ability to adequately monitor your vital signs. Wear Clean/Comfortable clothing the morning of surgery Remember to brush your teeth WITH YOUR REGULAR TOOTHPASTE.   Please read over the following fact sheets that you were given.    If you received a COVID test during your pre-op visit  it is requested that you wear a mask when out in public, stay away from anyone that may not be feeling well and notify your surgeon if you develop symptoms. If you have been in contact with anyone that has tested positive in the last 10 days please notify you surgeon.

## 2022-01-27 ENCOUNTER — Ambulatory Visit (HOSPITAL_COMMUNITY): Admission: RE | Admit: 2022-01-27 | Payer: BC Managed Care – PPO | Source: Home / Self Care | Admitting: Surgery

## 2022-01-27 ENCOUNTER — Encounter (HOSPITAL_COMMUNITY): Admission: RE | Payer: Self-pay | Source: Home / Self Care

## 2022-01-27 SURGERY — EXCISION, SIMPLE PILONIDAL CYST
Anesthesia: General

## 2022-02-04 ENCOUNTER — Encounter (INDEPENDENT_AMBULATORY_CARE_PROVIDER_SITE_OTHER): Payer: Self-pay

## 2022-10-15 ENCOUNTER — Other Ambulatory Visit: Payer: Self-pay | Admitting: Family Medicine

## 2022-10-15 DIAGNOSIS — Z1231 Encounter for screening mammogram for malignant neoplasm of breast: Secondary | ICD-10-CM

## 2022-12-07 ENCOUNTER — Ambulatory Visit
Admission: RE | Admit: 2022-12-07 | Discharge: 2022-12-07 | Disposition: A | Discharge status: Home / Self Care | Payer: BC Managed Care – PPO | Source: Ambulatory Visit | Attending: Family Medicine | Admitting: Family Medicine

## 2022-12-07 DIAGNOSIS — Z1231 Encounter for screening mammogram for malignant neoplasm of breast: Secondary | ICD-10-CM

## 2023-09-20 ENCOUNTER — Other Ambulatory Visit (HOSPITAL_BASED_OUTPATIENT_CLINIC_OR_DEPARTMENT_OTHER): Payer: Self-pay

## 2023-09-20 MED ORDER — MONTELUKAST SODIUM 10 MG PO TABS
10.0000 mg | ORAL_TABLET | Freq: Every day | ORAL | 3 refills | Status: DC
  Filled 2023-09-20 – 2023-09-24 (×2): qty 30, 30d supply, fill #0
  Filled 2023-11-27 – 2023-12-12 (×2): qty 30, 30d supply, fill #1
  Filled 2024-01-07 (×2): qty 30, 30d supply, fill #2
  Filled 2024-03-03 – 2024-04-06 (×2): qty 30, 30d supply, fill #3

## 2023-09-20 MED ORDER — MOUNJARO 2.5 MG/0.5ML ~~LOC~~ SOAJ
2.5000 mg | SUBCUTANEOUS | 0 refills | Status: AC
  Filled 2023-09-20 – 2023-10-04 (×4): qty 2, 28d supply, fill #0

## 2023-09-21 ENCOUNTER — Other Ambulatory Visit (HOSPITAL_BASED_OUTPATIENT_CLINIC_OR_DEPARTMENT_OTHER): Payer: Self-pay

## 2023-09-22 ENCOUNTER — Other Ambulatory Visit (HOSPITAL_BASED_OUTPATIENT_CLINIC_OR_DEPARTMENT_OTHER): Payer: Self-pay

## 2023-09-23 ENCOUNTER — Other Ambulatory Visit (HOSPITAL_BASED_OUTPATIENT_CLINIC_OR_DEPARTMENT_OTHER): Payer: Self-pay

## 2023-09-24 ENCOUNTER — Other Ambulatory Visit (HOSPITAL_BASED_OUTPATIENT_CLINIC_OR_DEPARTMENT_OTHER): Payer: Self-pay

## 2023-09-27 ENCOUNTER — Other Ambulatory Visit (HOSPITAL_BASED_OUTPATIENT_CLINIC_OR_DEPARTMENT_OTHER): Payer: Self-pay

## 2023-09-28 ENCOUNTER — Other Ambulatory Visit (HOSPITAL_BASED_OUTPATIENT_CLINIC_OR_DEPARTMENT_OTHER): Payer: Self-pay

## 2023-09-29 ENCOUNTER — Other Ambulatory Visit (HOSPITAL_BASED_OUTPATIENT_CLINIC_OR_DEPARTMENT_OTHER): Payer: Self-pay

## 2023-09-30 ENCOUNTER — Other Ambulatory Visit (HOSPITAL_BASED_OUTPATIENT_CLINIC_OR_DEPARTMENT_OTHER): Payer: Self-pay

## 2023-10-01 ENCOUNTER — Other Ambulatory Visit (HOSPITAL_BASED_OUTPATIENT_CLINIC_OR_DEPARTMENT_OTHER): Payer: Self-pay

## 2023-10-04 ENCOUNTER — Other Ambulatory Visit (HOSPITAL_BASED_OUTPATIENT_CLINIC_OR_DEPARTMENT_OTHER): Payer: Self-pay

## 2023-10-29 ENCOUNTER — Other Ambulatory Visit (HOSPITAL_BASED_OUTPATIENT_CLINIC_OR_DEPARTMENT_OTHER): Payer: Self-pay

## 2023-10-29 MED ORDER — MOUNJARO 5 MG/0.5ML ~~LOC~~ SOAJ
5.0000 mg | SUBCUTANEOUS | 1 refills | Status: AC
  Filled 2023-10-29: qty 2, 28d supply, fill #0

## 2023-10-29 MED ORDER — MOUNJARO 5 MG/0.5ML ~~LOC~~ SOAJ
5.0000 mg | SUBCUTANEOUS | 1 refills | Status: AC
Start: 2023-10-29 — End: ?
  Filled 2023-10-29 – 2023-12-12 (×3): qty 2, 28d supply, fill #0
  Filled 2024-01-05 – 2024-01-07 (×2): qty 2, 28d supply, fill #1

## 2023-11-18 ENCOUNTER — Other Ambulatory Visit: Payer: Self-pay | Admitting: Family Medicine

## 2023-11-18 DIAGNOSIS — Z1231 Encounter for screening mammogram for malignant neoplasm of breast: Secondary | ICD-10-CM

## 2023-11-27 ENCOUNTER — Other Ambulatory Visit (HOSPITAL_BASED_OUTPATIENT_CLINIC_OR_DEPARTMENT_OTHER): Payer: Self-pay

## 2023-12-09 ENCOUNTER — Other Ambulatory Visit (HOSPITAL_BASED_OUTPATIENT_CLINIC_OR_DEPARTMENT_OTHER): Payer: Self-pay

## 2023-12-13 ENCOUNTER — Other Ambulatory Visit (HOSPITAL_BASED_OUTPATIENT_CLINIC_OR_DEPARTMENT_OTHER): Payer: Self-pay

## 2023-12-15 ENCOUNTER — Ambulatory Visit: Payer: Self-pay

## 2023-12-21 ENCOUNTER — Ambulatory Visit

## 2024-01-04 ENCOUNTER — Ambulatory Visit

## 2024-01-05 ENCOUNTER — Other Ambulatory Visit (HOSPITAL_BASED_OUTPATIENT_CLINIC_OR_DEPARTMENT_OTHER): Payer: Self-pay

## 2024-01-07 ENCOUNTER — Other Ambulatory Visit: Payer: Self-pay

## 2024-01-07 ENCOUNTER — Other Ambulatory Visit (HOSPITAL_BASED_OUTPATIENT_CLINIC_OR_DEPARTMENT_OTHER): Payer: Self-pay

## 2024-01-10 ENCOUNTER — Ambulatory Visit
Admission: RE | Admit: 2024-01-10 | Discharge: 2024-01-10 | Disposition: A | Discharge status: Home / Self Care | Source: Ambulatory Visit | Attending: Family Medicine | Admitting: Family Medicine

## 2024-01-10 DIAGNOSIS — Z1231 Encounter for screening mammogram for malignant neoplasm of breast: Secondary | ICD-10-CM

## 2024-01-11 ENCOUNTER — Other Ambulatory Visit (HOSPITAL_BASED_OUTPATIENT_CLINIC_OR_DEPARTMENT_OTHER): Payer: Self-pay

## 2024-01-11 MED ORDER — MOUNJARO 5 MG/0.5ML ~~LOC~~ SOAJ: 5.0000 mg | SUBCUTANEOUS | 1 refills | Status: DC

## 2024-02-03 ENCOUNTER — Other Ambulatory Visit (HOSPITAL_BASED_OUTPATIENT_CLINIC_OR_DEPARTMENT_OTHER): Payer: Self-pay

## 2024-02-03 MED ORDER — MOUNJARO 7.5 MG/0.5ML ~~LOC~~ SOAJ
7.5000 mg | SUBCUTANEOUS | 2 refills | Status: DC
  Filled 2024-02-03: qty 2, 28d supply, fill #0
  Filled 2024-03-03 – 2024-03-14 (×2): qty 2, 28d supply, fill #1
  Filled 2024-04-06 – 2024-04-10 (×2): qty 2, 28d supply, fill #2

## 2024-03-13 ENCOUNTER — Other Ambulatory Visit (HOSPITAL_BASED_OUTPATIENT_CLINIC_OR_DEPARTMENT_OTHER): Payer: Self-pay

## 2024-03-14 ENCOUNTER — Other Ambulatory Visit (HOSPITAL_BASED_OUTPATIENT_CLINIC_OR_DEPARTMENT_OTHER): Payer: Self-pay

## 2024-04-06 ENCOUNTER — Other Ambulatory Visit (HOSPITAL_BASED_OUTPATIENT_CLINIC_OR_DEPARTMENT_OTHER): Payer: Self-pay

## 2024-04-07 ENCOUNTER — Other Ambulatory Visit (HOSPITAL_BASED_OUTPATIENT_CLINIC_OR_DEPARTMENT_OTHER): Payer: Self-pay

## 2024-04-07 ENCOUNTER — Other Ambulatory Visit: Payer: Self-pay

## 2024-05-04 ENCOUNTER — Other Ambulatory Visit (HOSPITAL_BASED_OUTPATIENT_CLINIC_OR_DEPARTMENT_OTHER): Payer: Self-pay

## 2024-05-04 MED ORDER — MONTELUKAST SODIUM 10 MG PO TABS
10.0000 mg | ORAL_TABLET | Freq: Every day | ORAL | 0 refills | Status: AC
  Filled 2024-05-04: qty 30, 30d supply, fill #0

## 2024-05-15 ENCOUNTER — Other Ambulatory Visit (HOSPITAL_BASED_OUTPATIENT_CLINIC_OR_DEPARTMENT_OTHER): Payer: Self-pay

## 2024-05-23 ENCOUNTER — Other Ambulatory Visit (HOSPITAL_BASED_OUTPATIENT_CLINIC_OR_DEPARTMENT_OTHER): Payer: Self-pay

## 2024-05-23 MED ORDER — LOSARTAN POTASSIUM-HCTZ 100-25 MG PO TABS
1.0000 | ORAL_TABLET | Freq: Every day | ORAL | 0 refills | Status: AC
  Filled 2024-05-23: qty 90, 90d supply, fill #0

## 2024-05-23 MED ORDER — MOUNJARO 7.5 MG/0.5ML ~~LOC~~ SOAJ
7.5000 mg | SUBCUTANEOUS | 2 refills | Status: AC
  Filled 2024-05-23: qty 2, 28d supply, fill #0
